# Patient Record
Sex: Male | Born: 1980 | Smoking: Never smoker
Health system: Southern US, Community
[De-identification: ages and names within clinical notes are randomized; demographics above are authoritative.]

## PROBLEM LIST (undated history)

## (undated) DIAGNOSIS — F419 Anxiety disorder, unspecified: Secondary | ICD-10-CM

## (undated) DIAGNOSIS — D352 Benign neoplasm of pituitary gland: Secondary | ICD-10-CM

## (undated) DIAGNOSIS — M542 Cervicalgia: Secondary | ICD-10-CM

## (undated) DIAGNOSIS — F32A Depression, unspecified: Secondary | ICD-10-CM

## (undated) DIAGNOSIS — K219 Gastro-esophageal reflux disease without esophagitis: Secondary | ICD-10-CM

## (undated) DIAGNOSIS — E039 Hypothyroidism, unspecified: Secondary | ICD-10-CM

## (undated) DIAGNOSIS — F909 Attention-deficit hyperactivity disorder, unspecified type: Secondary | ICD-10-CM

## (undated) DIAGNOSIS — J189 Pneumonia, unspecified organism: Secondary | ICD-10-CM

## (undated) HISTORY — DX: Anxiety disorder, unspecified: F41.9

## (undated) HISTORY — DX: Attention-deficit hyperactivity disorder, unspecified type: F90.9

## (undated) HISTORY — DX: Hypothyroidism, unspecified: E03.9

## (undated) HISTORY — DX: Depression, unspecified: F32.A

## (undated) HISTORY — PX: OTHER SURGICAL HISTORY: SHX169

## (undated) HISTORY — DX: Cervicalgia: M54.2

## (undated) HISTORY — DX: Benign neoplasm of pituitary gland: D35.2

---

## 2008-12-18 DIAGNOSIS — D352 Benign neoplasm of pituitary gland: Secondary | ICD-10-CM | POA: Diagnosis present

## 2016-01-23 ENCOUNTER — Other Ambulatory Visit: Payer: Self-pay

## 2016-03-24 ENCOUNTER — Encounter (INDEPENDENT_AMBULATORY_CARE_PROVIDER_SITE_OTHER): Payer: Self-pay | Admitting: Vascular Neurology

## 2016-03-24 ENCOUNTER — Ambulatory Visit (INDEPENDENT_AMBULATORY_CARE_PROVIDER_SITE_OTHER): Payer: No Typology Code available for payment source | Admitting: Vascular Neurology

## 2016-03-24 VITALS — BP 110/78 | HR 83 | Ht 74.0 in | Wt 205.0 lb

## 2016-03-24 DIAGNOSIS — F909 Attention-deficit hyperactivity disorder, unspecified type: Secondary | ICD-10-CM | POA: Diagnosis present

## 2016-03-24 DIAGNOSIS — R480 Dyslexia and alexia: Secondary | ICD-10-CM

## 2016-03-24 DIAGNOSIS — M542 Cervicalgia: Secondary | ICD-10-CM

## 2016-03-24 DIAGNOSIS — F819 Developmental disorder of scholastic skills, unspecified: Secondary | ICD-10-CM

## 2016-03-24 NOTE — Progress Notes (Signed)
Subjective:      Patient ID: Reginald Compton is a 35 y.o. male.    HPI   This is a 35 year old man who presents today for neurologic evaluation.  He was diagnosed with dyslexia as a young child and says that in grade school.  He had an IEP, as well as accommodations for tests, which she found to be very helpful.  His wife also reported that his mother apparently said he was noted to have attention deficit/hyperactivity disorder as a child.  He fidgeted as a kid, but does not fidget much now.  Currently he denies any significant concentration or focusing difficulties, perhaps he has some mild symptoms.  He has primarily difficulty with reading and spelling.  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  He has some exams coming up, and feels like he would benefit from additional time.    He denies any significant headaches, dizziness, weakness, numbness or tingling.  He does have a history of neck pain and other neck issues for which she saw a chiropractor and started exercises which have helped quite a bit.    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    He has a history of mild anxiety and depression, which is controlled currently.    The following portions of the patient's history were reviewed and updated as appropriate: allergies, current medications, past family history, past medical history, past social history, past surgical history and problem list.      Review of Systems    Constitutional: Negative for fever.   Respiratory: Negative for shortness of breath.    Cardiovascular: Negative for chest pain.   Gastrointestinal: Negative for abdominal pain.   Neurological: Negative for dizziness, weakness, numbness and headaches.   Psychiatric/Behavioral: Negative for sleep disturbance.   All other systems reviewed and are negative.    Objective:    Neurologic Exam  General: WDWN, NAD. Vital signs reviewed.  Heart: RRR  Neck: Supple, w/o bruits noted. Full ROM.  Extremities: No edema noted in the hands or feet.   Chest:  CTA  Abdomen: Non-tender/non-distended    NEUROLOGICAL EXAM    Mental Status: Awake, alert, oriented. Speech fluent. Follows commands well. Attention, memory, and concentration intact. Fund of knowledge appropriate for education.     Cranial Nerves: Pupils equally round and reactive to light bilaterally. Fundoscopic exam shows sharp discs without pallor bilaterally. Extraocular movements are full. No nystagmus seen. Facial sensation to light touch is intact. Face is symmetric. Hearing is intact to conversational speech. Tongue and palate are midline. Cranial nerves II-XII otherwise intact.     Motor: Full strength throughout. Bulk normal. No tremor noted. Tone appears normal.    Sensation: Light touch intact.     Coordination: Finger to nose and heel to shin testing intact without dysmetria.     Gait: Normal and steady.      DTRs: 2+ throughout. Toes downgoing.        Assessment:     1. Dyslexia    2. Learning disability    3. Neck pain    4. Hx of Attention deficit hyperactivity disorder (ADHD), unspecified ADHD type          Plan:     1.  Referral to neuropsychology.  2.  Continue physical therapy exercises for neck  pain.  3.  Further recommendations pending results of above.    4.  Follow-up with me in 2 months after testing complete.    The patient will return for follow-up as outlined above. If there are any problems with medications (if prescribed) or questions about any available test results, or if there are any new symptoms or changes in current symptoms, the patient is instructed to call the office.     Reginald B. Stacy Gardner, MD  Board Certified, Neurology  Board Certified, Clinical Neurophysiology  Board Certified, Vascular Neurology  Board Certified, Sleep Medicine

## 2016-03-28 ENCOUNTER — Encounter (INDEPENDENT_AMBULATORY_CARE_PROVIDER_SITE_OTHER): Payer: Self-pay

## 2016-05-26 ENCOUNTER — Ambulatory Visit (INDEPENDENT_AMBULATORY_CARE_PROVIDER_SITE_OTHER): Payer: No Typology Code available for payment source | Admitting: Vascular Neurology

## 2018-07-20 ENCOUNTER — Other Ambulatory Visit: Payer: Self-pay | Admitting: Urology

## 2020-04-28 ENCOUNTER — Other Ambulatory Visit (HOSPITAL_COMMUNITY)
Admission: RE | Admit: 2020-04-28 | Discharge: 2020-04-28 | Disposition: A | Discharge status: Home / Self Care | Payer: BC Managed Care – PPO | Source: Ambulatory Visit | Attending: Orthopaedic Surgery | Admitting: Orthopaedic Surgery

## 2020-04-28 DIAGNOSIS — S82851A Displaced trimalleolar fracture of right lower leg, initial encounter for closed fracture: Secondary | ICD-10-CM | POA: Diagnosis present

## 2020-04-28 DIAGNOSIS — S93401A Sprain of unspecified ligament of right ankle, initial encounter: Secondary | ICD-10-CM | POA: Diagnosis not present

## 2020-04-28 DIAGNOSIS — Z01812 Encounter for preprocedural laboratory examination: Secondary | ICD-10-CM | POA: Insufficient documentation

## 2020-04-28 DIAGNOSIS — Z20822 Contact with and (suspected) exposure to covid-19: Secondary | ICD-10-CM | POA: Insufficient documentation

## 2020-04-28 DIAGNOSIS — X58XXXA Exposure to other specified factors, initial encounter: Secondary | ICD-10-CM | POA: Diagnosis not present

## 2020-04-28 LAB — SARS CORONAVIRUS 2 (TAT 6-24 HRS): SARS Coronavirus 2: NEGATIVE

## 2020-04-29 ENCOUNTER — Other Ambulatory Visit: Payer: Self-pay | Admitting: Orthopaedic Surgery

## 2020-04-29 ENCOUNTER — Encounter (HOSPITAL_BASED_OUTPATIENT_CLINIC_OR_DEPARTMENT_OTHER): Payer: Self-pay | Admitting: Orthopaedic Surgery

## 2020-04-29 ENCOUNTER — Other Ambulatory Visit: Payer: Self-pay

## 2020-04-29 NOTE — Progress Notes (Signed)

## 2020-04-30 ENCOUNTER — Ambulatory Visit (HOSPITAL_BASED_OUTPATIENT_CLINIC_OR_DEPARTMENT_OTHER): Payer: BC Managed Care – PPO | Admitting: Anesthesiology

## 2020-04-30 ENCOUNTER — Encounter (HOSPITAL_BASED_OUTPATIENT_CLINIC_OR_DEPARTMENT_OTHER): Payer: Self-pay | Admitting: Orthopaedic Surgery

## 2020-04-30 ENCOUNTER — Ambulatory Visit (HOSPITAL_BASED_OUTPATIENT_CLINIC_OR_DEPARTMENT_OTHER)
Admission: RE | Admit: 2020-04-30 | Discharge: 2020-04-30 | Disposition: A | Discharge status: Home / Self Care | Payer: BC Managed Care – PPO | Attending: Orthopaedic Surgery | Admitting: Orthopaedic Surgery

## 2020-04-30 ENCOUNTER — Encounter (HOSPITAL_BASED_OUTPATIENT_CLINIC_OR_DEPARTMENT_OTHER)
Admission: RE | Disposition: A | Discharge status: Home / Self Care | Payer: Self-pay | Source: Home / Self Care | Attending: Orthopaedic Surgery

## 2020-04-30 ENCOUNTER — Ambulatory Visit (HOSPITAL_COMMUNITY): Payer: BC Managed Care – PPO

## 2020-04-30 ENCOUNTER — Other Ambulatory Visit: Payer: Self-pay

## 2020-04-30 DIAGNOSIS — Z20822 Contact with and (suspected) exposure to covid-19: Secondary | ICD-10-CM | POA: Insufficient documentation

## 2020-04-30 DIAGNOSIS — S93401A Sprain of unspecified ligament of right ankle, initial encounter: Secondary | ICD-10-CM | POA: Insufficient documentation

## 2020-04-30 DIAGNOSIS — X58XXXA Exposure to other specified factors, initial encounter: Secondary | ICD-10-CM | POA: Insufficient documentation

## 2020-04-30 DIAGNOSIS — S82851A Displaced trimalleolar fracture of right lower leg, initial encounter for closed fracture: Secondary | ICD-10-CM | POA: Insufficient documentation

## 2020-04-30 DIAGNOSIS — Z419 Encounter for procedure for purposes other than remedying health state, unspecified: Secondary | ICD-10-CM

## 2020-04-30 HISTORY — DX: Gastro-esophageal reflux disease without esophagitis: K21.9

## 2020-04-30 HISTORY — PX: ORIF ANKLE FRACTURE: SHX5408

## 2020-04-30 SURGERY — OPEN REDUCTION INTERNAL FIXATION (ORIF) ANKLE FRACTURE
Anesthesia: Regional | Site: Ankle | Laterality: Right

## 2020-04-30 MED ORDER — CEFAZOLIN SODIUM-DEXTROSE 2-4 GM/100ML-% IV SOLN
INTRAVENOUS | Status: AC
  Filled 2020-04-30: qty 100

## 2020-04-30 MED ORDER — FENTANYL CITRATE (PF) 100 MCG/2ML IJ SOLN
INTRAMUSCULAR | Status: AC
  Filled 2020-04-30: qty 2

## 2020-04-30 MED ORDER — FENTANYL CITRATE (PF) 100 MCG/2ML IJ SOLN: 25.0000 ug | INTRAMUSCULAR | Status: DC | PRN

## 2020-04-30 MED ORDER — ASPIRIN 325 MG PO TABS: 325.0000 mg | ORAL_TABLET | Freq: Every day | ORAL | 11 refills | Status: AC

## 2020-04-30 MED ORDER — LIDOCAINE HCL (CARDIAC) PF 100 MG/5ML IV SOSY
PREFILLED_SYRINGE | INTRAVENOUS | Status: DC | PRN
Start: 2020-04-30 — End: 2020-04-30
  Administered 2020-04-30: 70 mg via INTRATRACHEAL

## 2020-04-30 MED ORDER — ONDANSETRON HCL 4 MG/2ML IJ SOLN
INTRAMUSCULAR | Status: DC | PRN
  Administered 2020-04-30: 4 mg via INTRAVENOUS

## 2020-04-30 MED ORDER — MIDAZOLAM HCL 2 MG/2ML IJ SOLN
2.0000 mg | Freq: Once | INTRAMUSCULAR | Status: AC
  Administered 2020-04-30: 15:00:00 2 mg via INTRAVENOUS

## 2020-04-30 MED ORDER — OXYCODONE HCL 5 MG PO TABS
ORAL_TABLET | ORAL | Status: AC
  Filled 2020-04-30: qty 1

## 2020-04-30 MED ORDER — PHENYLEPHRINE 40 MCG/ML (10ML) SYRINGE FOR IV PUSH (FOR BLOOD PRESSURE SUPPORT)
PREFILLED_SYRINGE | INTRAVENOUS | Status: AC
  Filled 2020-04-30: qty 10

## 2020-04-30 MED ORDER — ACETAMINOPHEN 500 MG PO TABS
1000.0000 mg | ORAL_TABLET | Freq: Once | ORAL | Status: AC
  Administered 2020-04-30: 14:00:00 1000 mg via ORAL

## 2020-04-30 MED ORDER — DEXAMETHASONE SODIUM PHOSPHATE 10 MG/ML IJ SOLN
INTRAMUSCULAR | Status: DC | PRN
  Administered 2020-04-30 (×2): 5 mg

## 2020-04-30 MED ORDER — FENTANYL CITRATE (PF) 100 MCG/2ML IJ SOLN
100.0000 ug | Freq: Once | INTRAMUSCULAR | Status: AC
  Administered 2020-04-30: 15:00:00 100 ug via INTRAVENOUS

## 2020-04-30 MED ORDER — OXYCODONE HCL 5 MG PO TABS
5.0000 mg | ORAL_TABLET | Freq: Once | ORAL | Status: AC
  Administered 2020-04-30: 19:00:00 5 mg via ORAL

## 2020-04-30 MED ORDER — FENTANYL CITRATE (PF) 100 MCG/2ML IJ SOLN
INTRAMUSCULAR | Status: DC | PRN
  Administered 2020-04-30 (×2): 50 ug via INTRAVENOUS

## 2020-04-30 MED ORDER — LIDOCAINE 2% (20 MG/ML) 5 ML SYRINGE
INTRAMUSCULAR | Status: AC
  Filled 2020-04-30: qty 5

## 2020-04-30 MED ORDER — MIDAZOLAM HCL 2 MG/2ML IJ SOLN
INTRAMUSCULAR | Status: AC
  Filled 2020-04-30: qty 2

## 2020-04-30 MED ORDER — ACETAMINOPHEN 500 MG PO TABS
ORAL_TABLET | ORAL | Status: AC
  Filled 2020-04-30: qty 2

## 2020-04-30 MED ORDER — ROPIVACAINE HCL 5 MG/ML IJ SOLN
INTRAMUSCULAR | Status: DC | PRN
  Administered 2020-04-30: 20 mL via PERINEURAL
  Administered 2020-04-30: 30 mL via PERINEURAL

## 2020-04-30 MED ORDER — PROPOFOL 10 MG/ML IV BOLUS
INTRAVENOUS | Status: DC | PRN
  Administered 2020-04-30: 200 mg via INTRAVENOUS

## 2020-04-30 MED ORDER — CEFAZOLIN SODIUM-DEXTROSE 2-4 GM/100ML-% IV SOLN
2.0000 g | INTRAVENOUS | Status: AC
  Administered 2020-04-30: 17:00:00 2 g via INTRAVENOUS

## 2020-04-30 MED ORDER — OXYCODONE HCL 5 MG PO TABS: 5.0000 mg | ORAL_TABLET | ORAL | 0 refills | Status: AC | PRN

## 2020-04-30 MED ORDER — PROPOFOL 10 MG/ML IV BOLUS
INTRAVENOUS | Status: AC
  Filled 2020-04-30: qty 20

## 2020-04-30 MED ORDER — ONDANSETRON HCL 4 MG/2ML IJ SOLN
INTRAMUSCULAR | Status: AC
  Filled 2020-04-30: qty 2

## 2020-04-30 MED ORDER — LACTATED RINGERS IV SOLN: INTRAVENOUS | Status: DC

## 2020-04-30 MED ORDER — PHENYLEPHRINE HCL (PRESSORS) 10 MG/ML IV SOLN
INTRAVENOUS | Status: DC | PRN
  Administered 2020-04-30 (×2): 80 ug via INTRAVENOUS

## 2020-04-30 SURGICAL SUPPLY — 83 items
APL PRP STRL LF DISP 70% ISPRP (MISCELLANEOUS) ×1
APL SKNCLS STERI-STRIP NONHPOA (GAUZE/BANDAGES/DRESSINGS)
BANDAGE ESMARK 6X9 LF (GAUZE/BANDAGES/DRESSINGS) ×1 IMPLANT
BENZOIN TINCTURE PRP APPL 2/3 (GAUZE/BANDAGES/DRESSINGS) IMPLANT
BIT DRILL 2 CANN GRADUATED (BIT) ×3 IMPLANT
BIT DRILL 2.5 CANN LNG (BIT) ×3 IMPLANT
BIT DRILL 2.6 CANN (BIT) ×3 IMPLANT
BIT DRILL 3 CANN ENDOSCOPIC (BIT) ×3 IMPLANT
BLADE SURG 15 STRL LF DISP TIS (BLADE) ×4 IMPLANT
BLADE SURG 15 STRL SS (BLADE) ×12
BNDG CMPR 9X6 STRL LF SNTH (GAUZE/BANDAGES/DRESSINGS) ×1
BNDG COHESIVE 4X5 TAN STRL (GAUZE/BANDAGES/DRESSINGS) IMPLANT
BNDG ELASTIC 6X5.8 VLCR STR LF (GAUZE/BANDAGES/DRESSINGS) ×6 IMPLANT
BNDG ESMARK 6X9 LF (GAUZE/BANDAGES/DRESSINGS) ×3
CHLORAPREP W/TINT 26 (MISCELLANEOUS) ×3 IMPLANT
CLOSURE WOUND 1/2 X4 (GAUZE/BANDAGES/DRESSINGS)
COVER BACK TABLE 60X90IN (DRAPES) ×3 IMPLANT
COVER MAYO STAND REUSABLE (DRAPES) ×3 IMPLANT
COVER MAYO STAND STRL (DRAPES) ×3 IMPLANT
COVER SURGICAL LIGHT HANDLE (MISCELLANEOUS) ×6 IMPLANT
COVER WAND RF STERILE (DRAPES) IMPLANT
CUFF TOURN SGL QUICK 34 (TOURNIQUET CUFF) ×3
CUFF TRNQT CYL 34X4.125X (TOURNIQUET CUFF) ×1 IMPLANT
DECANTER SPIKE VIAL GLASS SM (MISCELLANEOUS) IMPLANT
DRAPE C-ARM 42X72 X-RAY (DRAPES) IMPLANT
DRAPE C-ARMOR (DRAPES) ×3 IMPLANT
DRAPE EXTREMITY T 121X128X90 (DISPOSABLE) ×3 IMPLANT
DRAPE IMP U-DRAPE 54X76 (DRAPES) ×3 IMPLANT
DRAPE U-SHAPE 47X51 STRL (DRAPES) ×3 IMPLANT
ELECT REM PT RETURN 9FT ADLT (ELECTROSURGICAL) ×3
ELECTRODE REM PT RTRN 9FT ADLT (ELECTROSURGICAL) ×1 IMPLANT
GAUZE SPONGE 4X4 12PLY STRL (GAUZE/BANDAGES/DRESSINGS) ×3 IMPLANT
GAUZE XEROFORM 1X8 LF (GAUZE/BANDAGES/DRESSINGS) ×3 IMPLANT
GLOVE BIOGEL PI IND STRL 7.0 (GLOVE) ×1 IMPLANT
GLOVE BIOGEL PI INDICATOR 7.0 (GLOVE) ×2
GLOVE ORTHO TXT STRL SZ7.5 (GLOVE) ×3 IMPLANT
GLOVE SRG 8 PF TXTR STRL LF DI (GLOVE) ×1 IMPLANT
GLOVE SURG ENC TEXT LTX SZ7.5 (GLOVE) ×3 IMPLANT
GLOVE SURG UNDER POLY LF SZ8 (GLOVE) ×3
GOWN STRL REUS W/ TWL LRG LVL3 (GOWN DISPOSABLE) ×1 IMPLANT
GOWN STRL REUS W/ TWL XL LVL3 (GOWN DISPOSABLE) ×1 IMPLANT
GOWN STRL REUS W/TWL LRG LVL3 (GOWN DISPOSABLE) ×3
GOWN STRL REUS W/TWL XL LVL3 (GOWN DISPOSABLE) ×3
GUIDEWIRE 1.35MM (WIRE) ×6 IMPLANT
NS IRRIG 1000ML POUR BTL (IV SOLUTION) ×3 IMPLANT
PACK BASIN DAY SURGERY FS (CUSTOM PROCEDURE TRAY) ×3 IMPLANT
PAD CAST 4YDX4 CTTN HI CHSV (CAST SUPPLIES) ×1 IMPLANT
PADDING CAST COTTON 4X4 STRL (CAST SUPPLIES) ×3
PADDING CAST SYNTHETIC 4 (CAST SUPPLIES) ×4
PADDING CAST SYNTHETIC 4X4 STR (CAST SUPPLIES) ×2 IMPLANT
PENCIL SMOKE EVACUATOR (MISCELLANEOUS) ×3 IMPLANT
PLATE TITANIUM 6H RT FIB ANKLE (Plate) ×3 IMPLANT
SCREW CORTICAL 3MMX16MM (Screw) ×3 IMPLANT
SCREW CORTICAL 3MMX18MM (Screw) ×6 IMPLANT
SCREW CORTICAL LP 3.0X12MM (Screw) ×3 IMPLANT
SCREW LO-PRO TI 3.5X16MM (Screw) ×6 IMPLANT
SCREW LOCK 18X3XVALOPRFL (Screw) ×1 IMPLANT
SCREW LOCK TI QF 3X14 (Screw) ×6 IMPLANT
SCREW LOCKING 3.0X18 (Screw) ×3 IMPLANT
SCREW LP TI 3.5X14MM (Screw) ×6 IMPLANT
SCREW QCKFIX CANN 4.0X40MM (Screw) ×6 IMPLANT
SHEET MEDIUM DRAPE 40X70 STRL (DRAPES) ×3 IMPLANT
SLEEVE SCD COMPRESS KNEE MED (MISCELLANEOUS) ×3 IMPLANT
SPLINT FAST PLASTER 5X30 (CAST SUPPLIES) ×40
SPLINT PLASTER CAST FAST 5X30 (CAST SUPPLIES) ×20 IMPLANT
SPONGE LAP 18X18 RF (DISPOSABLE) ×6 IMPLANT
STAPLER VISISTAT 35W (STAPLE) ×3 IMPLANT
STOCKINETTE 6  STRL (DRAPES) ×2
STOCKINETTE 6 STRL (DRAPES) ×1 IMPLANT
STRIP CLOSURE SKIN 1/2X4 (GAUZE/BANDAGES/DRESSINGS) IMPLANT
SUCTION FRAZIER HANDLE 10FR (MISCELLANEOUS) ×2
SUCTION TUBE FRAZIER 10FR DISP (MISCELLANEOUS) ×1 IMPLANT
SUT ETHILON 3 0 PS 1 (SUTURE) ×3 IMPLANT
SUT MNCRL AB 3-0 PS2 18 (SUTURE) ×6 IMPLANT
SUT PDS AB 2-0 CT2 27 (SUTURE) ×3 IMPLANT
SUT VIC AB 2-0 SH 27 (SUTURE)
SUT VIC AB 2-0 SH 27XBRD (SUTURE) IMPLANT
SUT VIC AB 3-0 FS2 27 (SUTURE) IMPLANT
SYR BULB EAR ULCER 3OZ GRN STR (SYRINGE) ×3 IMPLANT
TOWEL GREEN STERILE FF (TOWEL DISPOSABLE) ×6 IMPLANT
TUBE CONNECTING 20'X1/4 (TUBING) ×1
TUBE CONNECTING 20X1/4 (TUBING) ×2 IMPLANT
UNDERPAD 30X36 HEAVY ABSORB (UNDERPADS AND DIAPERS) ×3 IMPLANT

## 2020-04-30 NOTE — Anesthesia Preprocedure Evaluation (Addendum)
Anesthesia Evaluation  Patient identified by MRN, date of birth, ID band Patient awake    Reviewed: Allergy & Precautions, NPO status , Patient's Chart, lab work & pertinent test results  Airway Mallampati: I  TM Distance: >3 FB Neck ROM: Full    Dental no notable dental hx. (+) Teeth Intact, Dental Advisory Given   Pulmonary neg pulmonary ROS,    Pulmonary exam normal breath sounds clear to auscultation       Cardiovascular negative cardio ROS Normal cardiovascular exam Rhythm:Regular Rate:Normal     Neuro/Psych negative neurological ROS  negative psych ROS   GI/Hepatic Neg liver ROS, GERD  Medicated and Controlled,  Endo/Other  negative endocrine ROS  Renal/GU negative Renal ROS  negative genitourinary   Musculoskeletal negative musculoskeletal ROS (+)   Abdominal   Peds  Hematology negative hematology ROS (+)   Anesthesia Other Findings   Reproductive/Obstetrics                            Anesthesia Physical Anesthesia Plan  ASA: II  Anesthesia Plan: General and Regional   Post-op Pain Management:  Regional for Post-op pain   Induction: Intravenous  PONV Risk Score and Plan: 2 and Ondansetron, Dexamethasone and Midazolam  Airway Management Planned: LMA  Additional Equipment:   Intra-op Plan:   Post-operative Plan: Extubation in OR  Informed Consent: I have reviewed the patients History and Physical, chart, labs and discussed the procedure including the risks, benefits and alternatives for the proposed anesthesia with the patient or authorized representative who has indicated his/her understanding and acceptance.     Dental advisory given  Plan Discussed with: CRNA  Anesthesia Plan Comments:         Anesthesia Quick Evaluation

## 2020-04-30 NOTE — Anesthesia Procedure Notes (Signed)
Anesthesia Regional Block: Popliteal block   Pre-Anesthetic Checklist: ,, timeout performed, Correct Patient, Correct Site, Correct Laterality, Correct Procedure, Correct Position, site marked, Risks and benefits discussed,  Surgical consent,  Pre-op evaluation,  At surgeon's request and post-op pain management  Laterality: Right  Prep: Maximum Sterile Barrier Precautions used, chloraprep       Needles:  Injection technique: Single-shot  Needle Type: Echogenic Stimulator Needle     Needle Length: 9cm  Needle Gauge: 22     Additional Needles:   Procedures:,,,, ultrasound used (permanent image in chart),,,,  Narrative:  Start time: 04/30/2020 2:53 PM End time: 04/30/2020 3:03 PM Injection made incrementally with aspirations every 5 mL.  Performed by: Personally  Anesthesiologist: Freddrick March, MD  Additional Notes: Monitors applied. No increased pain on injection. No increased resistance to injection. Injection made in 5cc increments. Good needle visualization. Patient tolerated procedure well.

## 2020-04-30 NOTE — Op Note (Addendum)
George Cooley male 39 y.o. 04/30/2020  PreOperative Diagnosis: Right trimalleolar ankle fracture dislocation   PostOperative Diagnosis: Right trimalleolar ankle fracture dislocation Syndesmotic disruption  PROCEDURE: Open treatment of right trimalleolar ankle fracture without posterior fixation Open treatment of syndesmosis Ankle stress view fluoroscopy  SURGEON: Melony Overly, MD  ASSISTANT: None  ANESTHESIA: General LMA with peripheral nerve blockade  FINDINGS: Long oblique fracture of the lateral malleolus with associated medial malleolar and posterior malleolar fragmentation. Avulsion of the syndesmotic ligaments  IMPLANTS: Arthrex distal fibular locking plate with 4-0 cannulated screws Fully threaded screws, 3.0 mm  INDICATIONS:39 y.o. male sustained a trimalleolar ankle fracture dislocation while getting out of a car.  It was a low energy but had significant displacement.  He was seen in the emergency department where closed reduction was performed.  Splint was placed.  He had gross instability.  He was seen in my office with continued displacement and meeting indications for open treatment.  There was concern for syndesmotic disruption as well.  Patient was indicated for surgery.   Patient understood the risks, benefits and alternatives to surgery which include but are not limited to wound healing complications, infection, nonunion, malunion, need for further surgery as well as damage to surrounding structures. They also understood the potential for continued pain in that there were no guarantees of acceptable outcome After weighing these risks the patient opted to proceed with surgery.  PROCEDURE: Patient was identified in the preoperative holding area.  The right leg was marked by myself.  Consent was signed by myself and the patient.  Block was performed by anesthesia in the preoperative holding area.  Patient was taken to the operative suite and placed supine on the  operative table.  General LMA anesthesia was induced without difficulty. Bump was placed under the operative hip and bone foam was used.  All bony prominences were well padded.  Tourniquet was placed on the operative thigh.  Preoperative antibiotics were given. The extremity was prepped and draped in the usual sterile fashion and surgical timeout was performed.  The limb was elevated and the tourniquet was inflated to 250 mmHg.  We began by making a longitudinal incision overlying the distal fibula.  This was taken sharply down through skin and subcutaneous tissue.  Blunt dissection was used to identify any branch of the superficial peroneal nerve was not visualized in the surgical field.  The incision was then taken sharply down to bone and the fracture site was identified.  The fracture site was mobilized.   The fracture site were cleaned with a rondure and curette of any fracture hematoma and callus formation.  Then the fracture of the fibula was reduced under direct visualization and held provisionally with a lobster claw.  Then fluoroscopy confirmed adequate reduction of the ankle mortise at that time.  Then a combination of locking and nonlocking screws were used after placement of a lag screw across the fracture by technique.  This provided good stability of the distal fibula fracture.  We then turned our attention to the medial malleolus.  There was continued displacement of the medial malleolus and therefore an incision was made overlying this.  This was taken sharply down through skin and subcutaneous tissue.  Bovie cautery was used for skin bleeders.  Then sharp dissection down to the fracture and the fracture site was identified.  The soft tissue flap was carried anteriorly to identify the medial gutter of the ankle joint.  Then the fracture site was mobilized and using a  curette and rondure the fracture was cleared out of hematoma and fracture callus.  Then the fracture was reduced and held  provisionally with a pointed reduction forcep.  This was done under direct visualization.  Then fluoroscopy confirmed adequate reduction.  2 4.0 mm partially-threaded cannulated screws were placed across the fracture site with good fixation.  The ankle was stressed and was found to be unstable with regard to the syndesmosis and there was concern for avulsion of the syndesmotic ligaments.    Further inspection after a separate deep incision was created anterior to the fibula the avulsed fragments of the syndesmotic ligaments about the AITFL were identified and reduced.  They were held provisionally with pointed reduction forcep.  In the syndesmosis was held reduced this way.  Then small 3.0 millimeter screws were used to fixate the bony fragmentations to reestablish the syndesmosis stability.  The wounds were irrigated and the deep tissue was closed with a 3-0 Monocryl.  The subcuticular tissue was closed with 3-0 Monocryl and the skin with staples.  Xeroform placed on the wounds as well as 4 x 4's and sterile she cotton.  He was placed in a nonweightbearing short leg splint.  He tolerated this well.  There were no complications.  He was awakened from anesthesia and taken recovery in stable condition.  POST OPERATIVE INSTRUCTIONS: Nonweightbearing on operative extremity Keep splint dry and limb elevated Continue 325 mg aspirin for DVT prophylaxis Call the office with concerns Follow-up in 2 weeks for splint removal, x-rays of the operative ankle, nonweightbearing and suture removal if appropriate.   TOURNIQUET TIME: Less than 1.5 hours  BLOOD LOSS:  Minimal         DRAINS: none         SPECIMEN: none       COMPLICATIONS:  * No complications entered in OR log *         Disposition: PACU - hemodynamically stable.         Condition: stable

## 2020-04-30 NOTE — Anesthesia Procedure Notes (Signed)
Anesthesia Regional Block: Adductor canal block   Pre-Anesthetic Checklist: ,, timeout performed, Correct Patient, Correct Site, Correct Laterality, Correct Procedure, Correct Position, site marked, Risks and benefits discussed,  Surgical consent,  Pre-op evaluation,  At surgeon's request and post-op pain management  Laterality: Right  Prep: Maximum Sterile Barrier Precautions used, chloraprep       Needles:  Injection technique: Single-shot  Needle Type: Echogenic Stimulator Needle     Needle Length: 9cm  Needle Gauge: 22     Additional Needles:   Procedures:,,,, ultrasound used (permanent image in chart),,,,  Narrative:  Start time: 04/30/2020 3:03 PM End time: 04/30/2020 3:07 PM Injection made incrementally with aspirations every 5 mL.  Performed by: Personally  Anesthesiologist: Freddrick March, MD  Additional Notes: Monitors applied. No increased pain on injection. No increased resistance to injection. Injection made in 5cc increments. Good needle visualization. Patient tolerated procedure well.

## 2020-04-30 NOTE — Anesthesia Procedure Notes (Signed)
Procedure Name: LMA Insertion Date/Time: 04/30/2020 4:33 PM Performed by: Glory Buff, CRNA Pre-anesthesia Checklist: Patient identified, Emergency Drugs available, Suction available and Patient being monitored Patient Re-evaluated:Patient Re-evaluated prior to induction Oxygen Delivery Method: Circle system utilized Preoxygenation: Pre-oxygenation with 100% oxygen Induction Type: IV induction LMA: LMA inserted LMA Size: 5.0 Number of attempts: 1 Placement Confirmation: positive ETCO2 Tube secured with: Tape Dental Injury: Teeth and Oropharynx as per pre-operative assessment

## 2020-04-30 NOTE — Transfer of Care (Signed)
Immediate Anesthesia Transfer of Care Note  Patient: George Cooley  Procedure(s) Performed: OPEN TREATMENT OF RIGHT TRIMALLEOLAR FRACTURE POSSIBLE SYNDESMOSIS (Right Ankle)  Patient Location: PACU  Anesthesia Type:General  Level of Consciousness: drowsy, patient cooperative and responds to stimulation  Airway & Oxygen Therapy: Patient Spontanous Breathing and Patient connected to face mask oxygen  Post-op Assessment: Report given to RN and Post -op Vital signs reviewed and stable  Post vital signs: Reviewed and stable  Last Vitals:  Vitals Value Taken Time  BP    Temp    Pulse 83 04/30/20 1813  Resp 14 04/30/20 1813  SpO2 98 % 04/30/20 1813  Vitals shown include unvalidated device data.  Last Pain:  Vitals:   04/30/20 1411  TempSrc: Oral  PainSc: 5          Complications: No complications documented.

## 2020-04-30 NOTE — Discharge Instructions (Signed)
[facility] POST-OP INSTRUCTIONS   Pain Management 1. The numbing medicine and your leg will last around 18 hours, take a dose of your pain medicine as soon as you feel it wearing off to avoid rebound pain. 2. Keep your foot elevated above heart level.  Make sure that your heel hangs free ('floats'). 3. Take all prescribed medication as directed. 4. If taking narcotic pain medication you may want to use an over-the-counter stool softener to avoid constipation. 5. You may take over-the-counter NSAIDs (ibuprofen, naproxen, etc.) as well as over-the-counter acetaminophen as directed on the packaging as a supplement for your pain and may also use it to wean away from the prescription medication.  Activity ? Non-weightbearing ? Keep splint intact  First Postoperative Visit 1. Your first postop visit will be at least 2 weeks after surgery.  This should be scheduled when you schedule surgery. 2. If you do not have a postoperative visit scheduled please call [phone] to schedule an appointment. 3. At the appointment your incision will be evaluated for suture removal, x-rays will be obtained if necessary.  General Instructions 1. Swelling is very common after foot and ankle surgery.  It often takes 3 months for the foot and ankle to begin to feel comfortable.  Some amount of swelling will persist for 6-12 months. 2. DO NOT change the dressing.  If there is a problem with the dressing (too tight, loose, gets wet, etc.) please contact Dr. Adair's office. 3. DO NOT get the dressing wet.  For showers you can use an over-the-counter cast cover or wrap a washcloth around the top of your dressing and then cover it with a plastic bag and tape it to your leg. 4. DO NOT soak the incision (no tubs, pools, bath, etc.) until you have approval from Dr. Adair.  Contact Dr. Adairs office or go to Emergency Room if: 1. Temperature above 101 F. 2. Increasing pain that is unresponsive to pain  medication or elevation 3. Excessive redness or swelling in your foot 4. Dressing problems - excessive bloody drainage, looseness or tightness, or if dressing gets wet 5. Develop pain, swelling, warmth, or discoloration of your calf  Post Anesthesia Home Care Instructions  Activity: Get plenty of rest for the remainder of the day. A responsible individual must stay with you for 24 hours following the procedure.  For the next 24 hours, DO NOT: -Drive a car -Operate machinery -Drink alcoholic beverages -Take any medication unless instructed by your physician -Make any legal decisions or sign important papers.  Meals: Start with liquid foods such as gelatin or soup. Progress to regular foods as tolerated. Avoid greasy, spicy, heavy foods. If nausea and/or vomiting occur, drink only clear liquids until the nausea and/or vomiting subsides. Call your physician if vomiting continues.  Special Instructions/Symptoms: Your throat may feel dry or sore from the anesthesia or the breathing tube placed in your throat during surgery. If this causes discomfort, gargle with warm salt water. The discomfort should disappear within 24 hours.  If you had a scopolamine patch placed behind your ear for the management of post- operative nausea and/or vomiting:  1. The medication in the patch is effective for 72 hours, after which it should be removed.  Wrap patch in a tissue and discard in the trash. Wash hands thoroughly with soap and water. 2. You may remove the patch earlier than 72 hours if you experience unpleasant side effects which may include dry mouth, dizziness or   or visual disturbances. 3. Avoid touching the patch. Wash your hands with soap and water after contact with the patch.     Regional Anesthesia Blocks  1. Numbness or the inability to move the "blocked" extremity may last from 3-48 hours after placement. The length of time depends on the medication injected and your individual response to  the medication. If the numbness is not going away after 48 hours, call your surgeon.  2. The extremity that is blocked will need to be protected until the numbness is gone and the  Strength has returned. Because you cannot feel it, you will need to take extra care to avoid injury. Because it may be weak, you may have difficulty moving it or using it. You may not know what position it is in without looking at it while the block is in effect.  3. For blocks in the legs and feet, returning to weight bearing and walking needs to be done carefully. You will need to wait until the numbness is entirely gone and the strength has returned. You should be able to move your leg and foot normally before you try and bear weight or walk. You will need someone to be with you when you first try to ensure you do not fall and possibly risk injury.  4. Bruising and tenderness at the needle site are common side effects and will resolve in a few days.  5. Persistent numbness or new problems with movement should be communicated to the surgeon or the [facility] ([phone])/ [facility] ([phone]).  

## 2020-04-30 NOTE — H&P (Signed)
PREOPERATIVE H&P  Chief Complaint: Right trimalleolar ankle fracture dislocation  HPI: George Cooley is a 39 y.o. male who presents for preoperative history and physical with a diagnosis of right trimalleolar ankle fracture dislocation.  Patient sustained this fracture less than 1 week ago.  He was seen in the emergency department where closed reduction was performed.  He had gross instability and was splinted.  He was seen in my office.  Given amount of displacement he was indicated for surgery.  Today he is here for surgical intervention in the form of open treatment of his ankle fracture with possible syndesmotic fixation. Symptoms are rated as moderate to severe, and have been worsening.  This is significantly impairing activities of daily living.  He has elected for surgical management.   Past Medical History:  Diagnosis Date  . GERD (gastroesophageal reflux disease)    Past Surgical History:  Procedure Laterality Date  . tendon on toe repair     Social History   Socioeconomic History  . Marital status: Married    Spouse name: Not on file  . Number of children: Not on file  . Years of education: Not on file  . Highest education level: Not on file  Occupational History  . Not on file  Tobacco Use  . Smoking status: Never Smoker  . Smokeless tobacco: Never Used  Vaping Use  . Vaping Use: Never used  Substance and Sexual Activity  . Alcohol use: Yes    Comment: 1/week  . Drug use: Never  . Sexual activity: Yes  Other Topics Concern  . Not on file  Social History Narrative  . Not on file   Social Determinants of Health   Financial Resource Strain: Not on file  Food Insecurity: Not on file  Transportation Needs: Not on file  Physical Activity: Not on file  Stress: Not on file  Social Connections: Not on file   History reviewed. No pertinent family history. No Known Allergies Prior to Admission medications   Medication Sig Start Date End Date Taking? Authorizing  Provider  esomeprazole (NEXIUM) 20 MG capsule Take 20 mg by mouth daily before breakfast.   Yes [provider]  fexofenadine (ALLEGRA) 180 MG tablet Take 180 mg by mouth daily with breakfast.   Yes [provider]  FIBER SELECT GUMMIES PO Take 2 tablets by mouth daily. 5g/per gummies   Yes [provider]  HYDROcodone-acetaminophen (NORCO/VICODIN) 5-325 MG tablet Take 1 tablet by mouth every 6 (six) hours as needed (pain.).   Yes [provider]  ibuprofen (ADVIL) 200 MG tablet Take 600 mg by mouth every 6 (six) hours as needed (for pain.).   Yes [provider]  Multiple Vitamin (MULTIVITAMIN WITH MINERALS) TABS tablet Take 1 tablet by mouth daily. One A Day Men's 50+   Yes [provider]  Omega-3 Fatty Acids (FISH OIL) 1200 MG CAPS Take 1,200 mg by mouth daily.   Yes [provider]  oxyCODONE-acetaminophen (PERCOCET/ROXICET) 5-325 MG tablet Take 1 tablet by mouth 3 (three) times daily as needed (for pain).   Yes [provider]  Probiotic Product (PROBIOTIC PO) Take 1 capsule by mouth daily.   Yes [provider]     Positive ROS: All other systems have been reviewed and were otherwise negative with the exception of those mentioned in the HPI and as above.  Physical Exam:  Vitals:   04/30/20 1510 04/30/20 1515  BP: 110/68 109/74  Pulse: 86 79  Resp: 20  20  Temp:    SpO2: 98% 99%   General: Alert, no acute distress Cardiovascular: No pedal edema Respiratory: No cyanosis, no use of accessory musculature GI: No organomegaly, abdomen is soft and non-tender Skin: No lesions in the area of chief complaint Neurologic: Sensation intact distally Psychiatric: Patient is competent for consent with normal mood and affect Lymphatic: No axillary or cervical lymphadenopathy  MUSCULOSKELETAL: Right leg demonstrates swelling and ecchymosis about the ankle and foot.  He has tenderness about the lateral medial  malleolus as well as the anterior ankle.  No skin tenting medially.  No tenderness about the dorsal midfoot.  Palpable dorsalis pedis pulse.  Sensation grossly intact distally.  Foot is warm and well-perfused.  Assessment: Right trimalleolar ankle fracture with possible syndesmotic disruption   Plan: Plan for open treatment of his right trimalleolar ankle fracture without posterior fixation.  We will then stress the syndesmosis and fix or stabilize as needed..  We discussed the risks, benefits and alternatives of surgery which include but are not limited to wound healing complications, infection, nonunion, malunion, need for further surgery, damage to surrounding structures and continued pain.  They understand there is no guarantees to an acceptable outcome.  After weighing these risks they opted to proceed with surgery.     Erle Crocker, MD    04/30/2020 4:04 PM

## 2020-04-30 NOTE — Progress Notes (Signed)
Assisted Dr. Lanetta Inch with right, ultrasound guided, popliteal, adductor canal block. Side rails up, monitors on throughout procedure. See vital signs in flow sheet. Tolerated Procedure well.

## 2020-05-01 NOTE — Anesthesia Postprocedure Evaluation (Signed)
Anesthesia Post Note  Patient: Control and instrumentation engineer  Procedure(s) Performed: OPEN TREATMENT OF RIGHT TRIMALLEOLAR FRACTURE POSSIBLE SYNDESMOSIS (Right Ankle)     Patient location during evaluation: PACU Anesthesia Type: Regional and General Level of consciousness: awake and alert Pain management: pain level controlled Vital Signs Assessment: post-procedure vital signs reviewed and stable Respiratory status: spontaneous breathing, nonlabored ventilation and respiratory function stable Cardiovascular status: blood pressure returned to baseline and stable Postop Assessment: no apparent nausea or vomiting Anesthetic complications: no   No complications documented.  Last Vitals:  Vitals:   04/30/20 1830 04/30/20 1851  BP: 109/78 117/84  Pulse: 81 80  Resp: 14 16  Temp:  36.5 C  SpO2: 95% 97%    Last Pain:  Vitals:   04/30/20 1846  TempSrc:   PainSc: 0-No pain                 Chakira Jachim,W. EDMOND

## 2020-05-04 ENCOUNTER — Encounter (HOSPITAL_BASED_OUTPATIENT_CLINIC_OR_DEPARTMENT_OTHER): Payer: Self-pay | Admitting: Orthopaedic Surgery

## 2021-01-22 ENCOUNTER — Other Ambulatory Visit: Payer: Self-pay | Admitting: Internal Medicine

## 2021-01-22 DIAGNOSIS — D352 Benign neoplasm of pituitary gland: Secondary | ICD-10-CM

## 2021-01-22 DIAGNOSIS — E221 Hyperprolactinemia: Secondary | ICD-10-CM

## 2021-02-02 ENCOUNTER — Other Ambulatory Visit: Payer: Self-pay | Admitting: Internal Medicine

## 2021-02-02 DIAGNOSIS — D352 Benign neoplasm of pituitary gland: Secondary | ICD-10-CM

## 2021-02-12 ENCOUNTER — Ambulatory Visit
Admission: RE | Admit: 2021-02-12 | Discharge: 2021-02-12 | Disposition: A | Discharge status: Home / Self Care | Payer: BC Managed Care – PPO | Source: Ambulatory Visit | Attending: Internal Medicine | Admitting: Internal Medicine

## 2021-02-12 DIAGNOSIS — E221 Hyperprolactinemia: Secondary | ICD-10-CM

## 2021-02-12 DIAGNOSIS — D352 Benign neoplasm of pituitary gland: Secondary | ICD-10-CM

## 2021-02-12 MED ORDER — GADOBENATE DIMEGLUMINE 529 MG/ML IV SOLN
10.0000 mL | Freq: Once | INTRAVENOUS | Status: AC | PRN
  Administered 2021-02-12: 10 mL via INTRAVENOUS

## 2021-04-27 IMAGING — RF DG C-ARM 1-60 MIN
1 series · 3 of 3 positions shown · non-contrast
Comparison: None.

CLINICAL DATA: Right ankle ORIF.

EXAM:
RIGHT ANKLE - 2 VIEW; DG C-ARM 1-60 MIN

[Series 1: run · 3 of 3 slices shown]
[im 1/3]
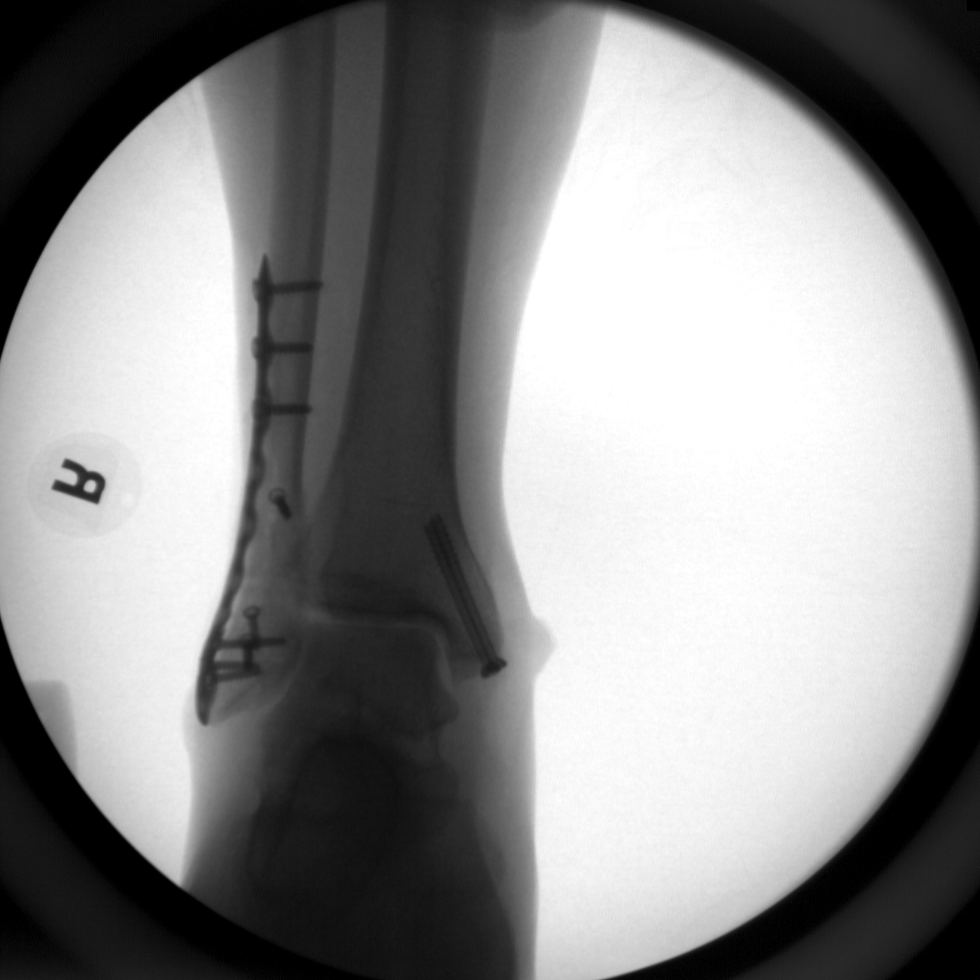
[im 2/3]
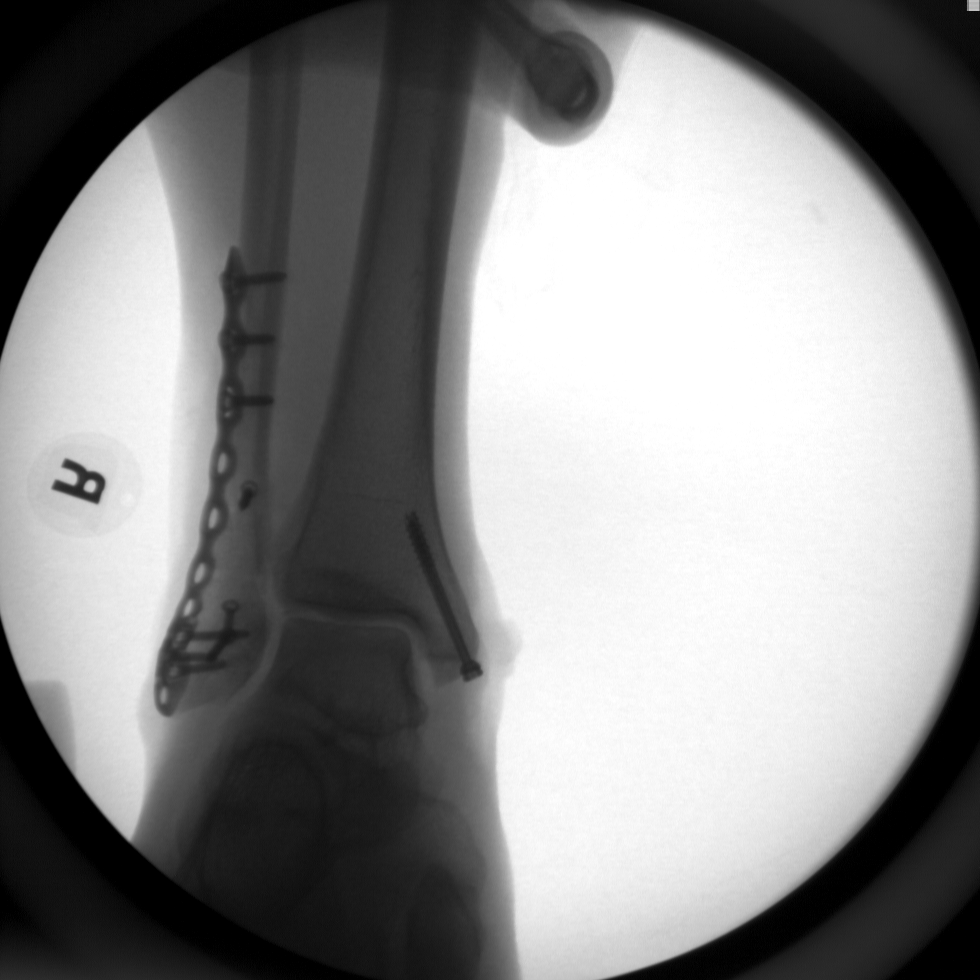
[im 3/3]
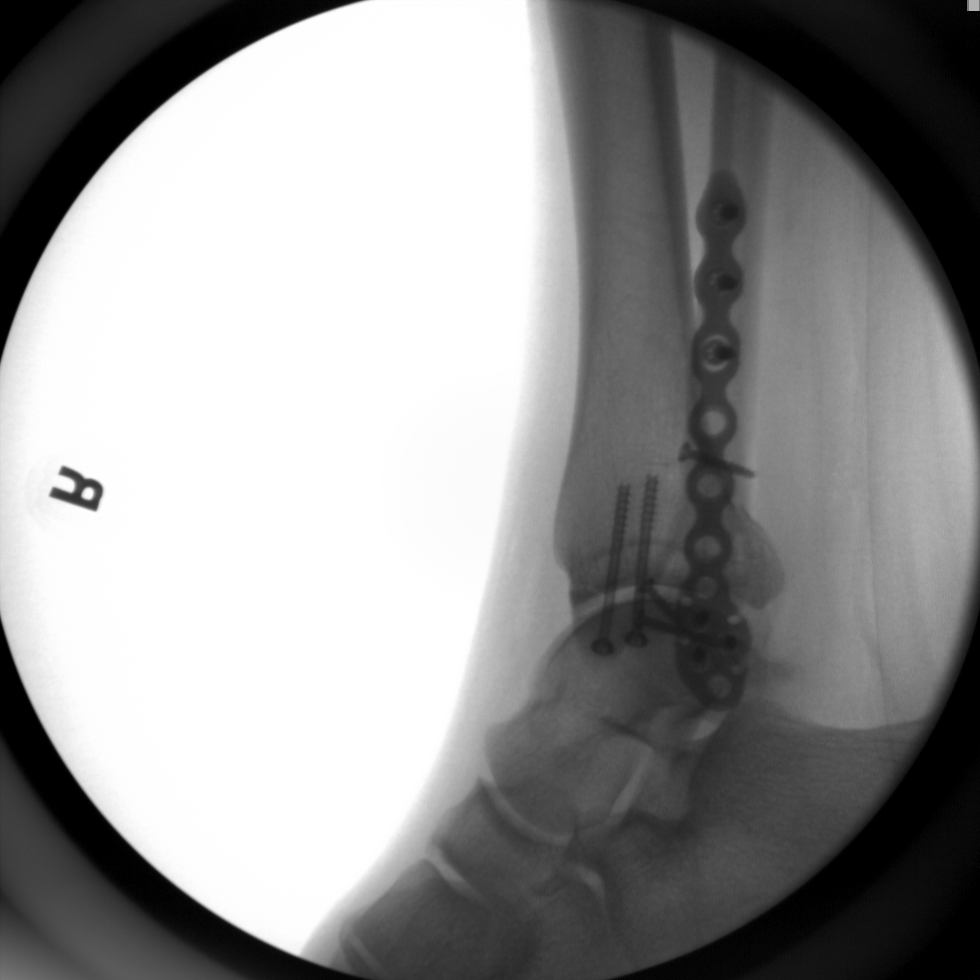

[3 of 3 positions shown; findings below may reference images not displayed]

FINDINGS: Three fluoroscopic spot views of the right ankle obtained in
frontal, lateral, and oblique projections. Lateral plate and multi
screw fixation of the distal fibula with additional interfragmentary
screw. Two screws traverse the medial malleolus. Total fluoroscopy
time 16 seconds. Dose 0.45 mGy.
IMPRESSION: Intraoperative fluoroscopy during right ankle ORIF.

## 2022-09-05 ENCOUNTER — Other Ambulatory Visit: Payer: Self-pay | Admitting: Internal Medicine

## 2022-09-05 DIAGNOSIS — E221 Hyperprolactinemia: Secondary | ICD-10-CM

## 2022-09-09 ENCOUNTER — Emergency Department (HOSPITAL_COMMUNITY): Payer: No Typology Code available for payment source

## 2022-09-09 ENCOUNTER — Inpatient Hospital Stay (HOSPITAL_COMMUNITY)
Admission: EM | Admit: 2022-09-09 | Discharge: 2022-09-16 | DRG: 853 | Disposition: A | Discharge status: Home Health | Payer: No Typology Code available for payment source | Attending: Surgery | Admitting: Surgery

## 2022-09-09 ENCOUNTER — Other Ambulatory Visit: Payer: Self-pay

## 2022-09-09 DIAGNOSIS — K219 Gastro-esophageal reflux disease without esophagitis: Secondary | ICD-10-CM

## 2022-09-09 DIAGNOSIS — D352 Benign neoplasm of pituitary gland: Secondary | ICD-10-CM | POA: Diagnosis present

## 2022-09-09 DIAGNOSIS — K5720 Diverticulitis of large intestine with perforation and abscess without bleeding: Secondary | ICD-10-CM | POA: Diagnosis present

## 2022-09-09 DIAGNOSIS — F32A Depression, unspecified: Secondary | ICD-10-CM | POA: Diagnosis present

## 2022-09-09 DIAGNOSIS — R222 Localized swelling, mass and lump, trunk: Secondary | ICD-10-CM

## 2022-09-09 DIAGNOSIS — K631 Perforation of intestine (nontraumatic): Secondary | ICD-10-CM

## 2022-09-09 DIAGNOSIS — F909 Attention-deficit hyperactivity disorder, unspecified type: Secondary | ICD-10-CM | POA: Diagnosis present

## 2022-09-09 DIAGNOSIS — A419 Sepsis, unspecified organism: Secondary | ICD-10-CM | POA: Insufficient documentation

## 2022-09-09 DIAGNOSIS — E785 Hyperlipidemia, unspecified: Secondary | ICD-10-CM | POA: Diagnosis present

## 2022-09-09 DIAGNOSIS — E039 Hypothyroidism, unspecified: Secondary | ICD-10-CM | POA: Diagnosis present

## 2022-09-09 DIAGNOSIS — R911 Solitary pulmonary nodule: Secondary | ICD-10-CM | POA: Diagnosis present

## 2022-09-09 DIAGNOSIS — S36533A Laceration of sigmoid colon, initial encounter: Secondary | ICD-10-CM | POA: Diagnosis present

## 2022-09-09 DIAGNOSIS — N179 Acute kidney failure, unspecified: Secondary | ICD-10-CM | POA: Diagnosis present

## 2022-09-09 DIAGNOSIS — R109 Unspecified abdominal pain: Secondary | ICD-10-CM | POA: Diagnosis not present

## 2022-09-09 DIAGNOSIS — X58XXXA Exposure to other specified factors, initial encounter: Secondary | ICD-10-CM | POA: Diagnosis present

## 2022-09-09 DIAGNOSIS — K567 Ileus, unspecified: Secondary | ICD-10-CM | POA: Diagnosis not present

## 2022-09-09 DIAGNOSIS — K659 Peritonitis, unspecified: Secondary | ICD-10-CM | POA: Diagnosis present

## 2022-09-09 LAB — COMPREHENSIVE METABOLIC PANEL
ALT: 30 U/L (ref 0–44)
AST: 31 U/L (ref 15–41)
Albumin: 4.4 g/dL (ref 3.5–5.0)
Alkaline Phosphatase: 60 U/L (ref 38–126)
Anion gap: 12 (ref 5–15)
BUN: 15 mg/dL (ref 6–20)
CO2: 21 mmol/L — ABNORMAL LOW (ref 22–32)
Calcium: 8.6 mg/dL — ABNORMAL LOW (ref 8.9–10.3)
Chloride: 101 mmol/L (ref 98–111)
Creatinine, Ser: 1.2 mg/dL (ref 0.61–1.24)
GFR, Estimated: >60 mL/min (ref >60)
Glucose, Bld: 130 mg/dL — ABNORMAL HIGH (ref 70–99)
Potassium: 3.7 mmol/L (ref 3.5–5.1)
Sodium: 134 mmol/L — ABNORMAL LOW (ref 135–145)
Total Bilirubin: 1.8 mg/dL — ABNORMAL HIGH (ref 0.3–1.2)
Total Protein: 7.6 g/dL (ref 6.5–8.1)

## 2022-09-09 LAB — CBC WITH DIFFERENTIAL/PLATELET
Abs Immature Granulocytes: 0.06 10*3/uL (ref 0.00–0.07)
Basophils Absolute: 0 10*3/uL (ref 0.0–0.1)
Basophils Relative: 0 %
Eosinophils Absolute: 0 10*3/uL (ref 0.0–0.5)
Eosinophils Relative: 0 %
HCT: 46.7 % (ref 39.0–52.0)
Hemoglobin: 15.7 g/dL (ref 13.0–17.0)
Immature Granulocytes: 0 %
Lymphocytes Relative: 4 %
Lymphs Abs: 0.6 10*3/uL — ABNORMAL LOW (ref 0.7–4.0)
MCH: 28.9 pg (ref 26.0–34.0)
MCHC: 33.6 g/dL (ref 30.0–36.0)
MCV: 86 fL (ref 80.0–100.0)
Monocytes Absolute: 0.7 10*3/uL (ref 0.1–1.0)
Monocytes Relative: 4 %
Neutro Abs: 14.4 10*3/uL — ABNORMAL HIGH (ref 1.7–7.7)
Neutrophils Relative %: 92 %
Platelets: 231 10*3/uL (ref 150–400)
RBC: 5.43 MIL/uL (ref 4.22–5.81)
RDW: 13.2 % (ref 11.5–15.5)
WBC: 15.8 10*3/uL — ABNORMAL HIGH (ref 4.0–10.5)
nRBC: 0 % (ref 0.0–0.2)

## 2022-09-09 LAB — URINALYSIS, ROUTINE W REFLEX MICROSCOPIC
Bacteria, UA: NONE SEEN
Bilirubin Urine: NEGATIVE
Glucose, UA: NEGATIVE mg/dL
Hgb urine dipstick: NEGATIVE
Ketones, ur: 5 mg/dL — AB
Leukocytes,Ua: NEGATIVE
Nitrite: NEGATIVE
Protein, ur: 30 mg/dL — AB
Specific Gravity, Urine: 1.015 (ref 1.005–1.030)
pH: 6 (ref 5.0–8.0)

## 2022-09-09 LAB — MAGNESIUM: Magnesium: 1.7 mg/dL (ref 1.7–2.4)

## 2022-09-09 LAB — LIPASE, BLOOD: Lipase: 36 U/L (ref 11–51)

## 2022-09-09 MED ORDER — IOHEXOL 300 MG/ML  SOLN
100.0000 mL | Freq: Once | INTRAMUSCULAR | Status: AC | PRN
  Administered 2022-09-09: 100 mL via INTRAVENOUS

## 2022-09-09 MED ORDER — ATORVASTATIN CALCIUM 10 MG PO TABS: 10.0000 mg | ORAL_TABLET | Freq: Every evening | ORAL | Status: DC

## 2022-09-09 MED ORDER — BUPROPION HCL ER (XL) 150 MG PO TB24
150.0000 mg | ORAL_TABLET | Freq: Every day | ORAL | Status: DC
  Administered 2022-09-10 – 2022-09-16 (×7): 150 mg via ORAL
  Filled 2022-09-09 (×7): qty 1

## 2022-09-09 MED ORDER — SERTRALINE HCL 100 MG PO TABS
200.0000 mg | ORAL_TABLET | Freq: Every day | ORAL | Status: DC
  Administered 2022-09-10 – 2022-09-15 (×6): 200 mg via ORAL
  Filled 2022-09-09 (×4): qty 2
  Filled 2022-09-09: qty 4
  Filled 2022-09-09: qty 2

## 2022-09-09 MED ORDER — PIPERACILLIN-TAZOBACTAM 3.375 G IVPB 30 MIN
3.3750 g | Freq: Once | INTRAVENOUS | Status: AC
  Administered 2022-09-09: 3.375 g via INTRAVENOUS
  Filled 2022-09-09: qty 50

## 2022-09-09 MED ORDER — PANTOPRAZOLE SODIUM 40 MG IV SOLR
40.0000 mg | INTRAVENOUS | Status: DC
  Administered 2022-09-09 – 2022-09-14 (×6): 40 mg via INTRAVENOUS
  Filled 2022-09-09 (×6): qty 10

## 2022-09-09 MED ORDER — ONDANSETRON HCL 4 MG PO TABS: 4.0000 mg | ORAL_TABLET | Freq: Four times a day (QID) | ORAL | Status: DC | PRN

## 2022-09-09 MED ORDER — LAMOTRIGINE 100 MG PO TABS
100.0000 mg | ORAL_TABLET | Freq: Every day | ORAL | Status: DC
  Administered 2022-09-10 – 2022-09-15 (×6): 100 mg via ORAL
  Filled 2022-09-09 (×6): qty 1

## 2022-09-09 MED ORDER — PIPERACILLIN-TAZOBACTAM 3.375 G IVPB
3.3750 g | Freq: Three times a day (TID) | INTRAVENOUS | Status: AC
  Administered 2022-09-10 – 2022-09-15 (×18): 3.375 g via INTRAVENOUS
  Filled 2022-09-09 (×18): qty 50

## 2022-09-09 MED ORDER — ONDANSETRON HCL 4 MG/2ML IJ SOLN
4.0000 mg | Freq: Once | INTRAMUSCULAR | Status: AC
  Administered 2022-09-09: 4 mg via INTRAVENOUS
  Filled 2022-09-09: qty 2

## 2022-09-09 MED ORDER — HYDROMORPHONE HCL 1 MG/ML IJ SOLN
1.0000 mg | Freq: Once | INTRAMUSCULAR | Status: AC
  Administered 2022-09-09: 1 mg via INTRAVENOUS
  Filled 2022-09-09: qty 1

## 2022-09-09 MED ORDER — LACTATED RINGERS IV BOLUS
1000.0000 mL | Freq: Once | INTRAVENOUS | Status: AC
  Administered 2022-09-09: 1000 mL via INTRAVENOUS

## 2022-09-09 MED ORDER — HYDROMORPHONE HCL 1 MG/ML IJ SOLN
0.5000 mg | INTRAMUSCULAR | Status: DC | PRN
  Administered 2022-09-10 (×4): 1 mg via INTRAVENOUS
  Filled 2022-09-09 (×4): qty 1

## 2022-09-09 MED ORDER — ONDANSETRON HCL 4 MG/2ML IJ SOLN
4.0000 mg | Freq: Four times a day (QID) | INTRAMUSCULAR | Status: DC | PRN
  Administered 2022-09-10: 4 mg via INTRAVENOUS
  Filled 2022-09-09 (×2): qty 2

## 2022-09-09 MED ORDER — LACTATED RINGERS IV SOLN: INTRAVENOUS | Status: DC

## 2022-09-09 MED ORDER — LEVOTHYROXINE SODIUM 88 MCG PO TABS
88.0000 ug | ORAL_TABLET | Freq: Every day | ORAL | Status: DC
  Administered 2022-09-10 – 2022-09-16 (×6): 88 ug via ORAL
  Filled 2022-09-09 (×6): qty 1

## 2022-09-09 NOTE — H&P (Signed)
History and Physical    Patient: George Cooley ZOX:[id number] DOB: 12/20/1980 DOA: 09/09/2022 DOS: the patient was seen and examined on 09/09/2022 PCP: Nonnie Done., MD  Patient coming from: Home - lives with his wife and son    Chief Complaint: acute abdominal pain   HPI: George Cooley is a 42 y.o. male with medical history significant of ADHD, hypothyroidism, HLD, depression, hypo testosterone, pituitary microadenoma who presented to ED with complaints of acute abdominal pain shortly after placing an enema around 130 this afternoon. He states he has been doing this x15-20 years, once to twice a day. He has a hose connected to his shower and a silicone tube that goes into his rectum.  He states after gave himself his enema he had a sharp and excruciating pain in his LLQ and then cramping of his abdominal muscles. His wife then massaged his stomach and this seemed to help as well as laying down; however, the pain continued to progress and became severe so came to Ed.  Pain rated as a 10/10, described as sharp. It radiated to the entire abdomen. He has had a lot of nausea and only one episode of vomiting. No fevers at home. His last normal BM was yesterday evening. He has not had any flatus. He has had some liquid (mainly water) come out. He has had a lot of burping.   He has no history of diverticulitis/colonoscopy.    He has been feeling good. Denies any fever/chills, vision changes/headaches, chest pain or palpitations, shortness of breath or cough, dysuria or leg swelling.    He does not smoke or drink alcohol.   ER Course:  vitals: afebrile, bp: 104/75, HR; 95, RR: 20, oxygen: 95%RA Pertinent labs: WBC: 15.8, t.bili: 1.8,  Abdominal xray: negative CT abdomen: There are few small pockets of air lying immediately adjacent to the rectosigmoid suggesting possible contained perforation. This may be due to trauma or related to colitis or diverticulitis. There is diffuse wall thickening  in rectosigmoid. Scattered diverticula are seen in colon. There is small amount of serous fluid in pelvic cavity. There is no demonstrable large intraperitoneal or retroperitoneal hematoma. Surgical consultation should be considered.   There is no hydronephrosis. There is no evidence of intestinal obstruction or pneumoperitoneum in the upper abdomen.   There is fluid in the lumen of lower thoracic esophagus suggesting gastroesophageal reflux. There is there is mild diffuse wall thickening in the urinary bladder which may be due to incomplete distention or suggest cystitis.   There is 10 mm pleural-based nodule in left lower lobe. Follow-up CT chest in 3 months may be considered. There are linear densities in the posterior lower lung fields suggesting possible subsegmental atelectasis. In ED: started on zosyn, given IVF, pain medication. General surgery consulted and [facility] asked to admit.     Review of Systems: As mentioned in the history of present illness. All other systems reviewed and are negative.  Social History:  has no history on file for tobacco use, alcohol use, and drug use.  No Known Allergies  No family history on file.  Prior to Admission medications   Not on File    Physical Exam: Vitals:   09/09/22 1815 09/09/22 1830 09/09/22 1845 09/09/22 1850  BP:  104/75 113/75   Pulse:  95 94 100  Resp: 18 20 (!) 22   Temp:  97.8 F (36.6 C)    TempSrc:  Oral    SpO2:  95% 94% 93%  General:  Appears calm and comfortable and is in NAD Eyes:  PERRL, EOMI, normal lids, iris ENT:  grossly normal hearing, lips & tongue, dry mucous membranes; appropriate dentition Neck:  no LAD, masses or thyromegaly; no carotid bruits Cardiovascular:  RRR, no m/r/g. No LE edema.  Respiratory:   CTA bilaterally with no wheezes/rales/rhonchi.  Normal respiratory effort. Abdomen:  soft, TTP in left lower quadrant and supra pubic area. BS+.  Back:   normal alignment, no CVAT Skin:  no  rash or induration seen on limited exam Musculoskeletal:  grossly normal tone BUE/BLE, good ROM, no bony abnormality Lower extremity:  No LE edema.  Limited foot exam with no ulcerations.  2+ distal pulses. Psychiatric:  grossly normal mood and affect, speech fluent and appropriate, AOx3 Neurologic:  CN 2-12 grossly intact, moves all extremities in coordinated fashion, sensation intact   Radiological Exams on Admission: Independently reviewed - see discussion in A/P where applicable  CT ABDOMEN PELVIS W CONTRAST  Result Date: 09/09/2022 CLINICAL DATA:  Lower abdominal pain EXAM: CT ABDOMEN AND PELVIS WITH CONTRAST TECHNIQUE: Multidetector CT imaging of the abdomen and pelvis was performed using the standard protocol following bolus administration of intravenous contrast. RADIATION DOSE REDUCTION: This exam was performed according to the departmental dose-optimization program which includes automated exposure control, adjustment of the mA and/or kV according to patient size and/or use of iterative reconstruction technique. CONTRAST:  OMNIPAQUE IOHEXOL 300 MG/ML  SOLN COMPARISON:  None Available. FINDINGS: Lower chest: There is crowding of markings in the posterior lower lung fields, possibly subsegmental atelectasis. In image 23 of series 5, there is 10 x 7 mm pleural-based nodule in the lateral left lower lung field in left lower lobe. Hepatobiliary: No focal abnormalities are seen in liver. Gallbladder is unremarkable. Pancreas: No focal abnormalities are seen. Spleen: Unremarkable. Adrenals/Urinary Tract: Adrenals are unremarkable. There is no hydronephrosis. There are no renal or ureteral stones. There is mild diffuse wall thickening in the urinary bladder. Stomach/Bowel: There is fluid in the lumen of lower thoracic esophagus suggesting gastroesophageal reflux. Stomach is moderately distended. Small bowel loops are not dilated. The appendix is not dilated. There is fluid in the lumen of  ascending colon. Scattered diverticula are seen in colon. There is mild diffuse wall thickening in rectosigmoid. There are few small pockets of air adjacent to the sigmoid colon in midline, possibly contained bowel perforation and extraluminal air due to trauma or acute diverticulitis or acute colitis. There is 4.9 x 3 cm serous fluid collection in the posterior pelvic cavity adjacent to the rectosigmoid. Vascular/Lymphatic: No acute findings are seen in aorta and its major branches. There are vascular coils in the course of left gonadal vein. Small scattered arterial calcifications are seen. Reproductive: Prostate is slightly enlarged. Other: There is no pneumoperitoneum in the upper abdomen. Small pockets of air adjacent to the rectosigmoid may suggest contained perforation. Bilateral inguinal hernias containing fat are seen, larger on the left side. Musculoskeletal: No acute findings are seen. IMPRESSION: There are few small pockets of air lying immediately adjacent to the rectosigmoid suggesting possible contained perforation. This may be due to trauma or related to colitis or diverticulitis. There is diffuse wall thickening in rectosigmoid. Scattered diverticula are seen in colon. There is small amount of serous fluid in pelvic cavity. There is no demonstrable large intraperitoneal or retroperitoneal hematoma. Surgical consultation should be considered. There is no hydronephrosis. There is no evidence of intestinal obstruction or pneumoperitoneum in the upper abdomen. There is  fluid in the lumen of lower thoracic esophagus suggesting gastroesophageal reflux. There is there is mild diffuse wall thickening in the urinary bladder which may be due to incomplete distention or suggest cystitis. There is 10 mm pleural-based nodule in left lower lobe. Follow-up CT chest in 3 months may be considered. There are linear densities in the posterior lower lung fields suggesting possible subsegmental atelectasis. Imaging  finding of possible contained perforation in rectosigmoid was relayed to patient's provider [facility] by telephone call. Electronically Signed   By: Ernie Avena M.D.   On: 09/09/2022 20:24   DG Abd Portable 1V  Result Date: 09/09/2022 CLINICAL DATA:  Pain, evaluate for free air. EXAM: PORTABLE ABDOMEN - 1 VIEW COMPARISON:  None Available. FINDINGS: The bowel gas pattern is normal. No free air identified. No radio-opaque calculi calculi. Embolic coils are seen in the left abdomen. IMPRESSION: Nonobstructive bowel gas pattern. Electronically Signed   By: Darliss Cheney M.D.   On: 09/09/2022 19:23      Labs on Admission: I have personally reviewed the available labs and imaging studies at the time of the admission.  Pertinent labs:   WBC: 15.8, t.bili: 1.8,  Assessment and Plan: Principal Problem:   sespsis secondary to perorated rectosigmoid colon Active Problems:   Pleural nodule   Depressive disorder   Pituitary microadenoma (HCC)   GERD (gastroesophageal reflux disease)   Attention deficit hyperactivity disorder (ADHD)   Sepsis (HCC)    Assessment and Plan: * sespsis secondary to perorated rectosigmoid colon 42  year old male presenting with acute onset of abdominal pain shortly after placing an enema found to have possible contained perforation in the rectosigmoid colon due to trauma from enema or diverticulitis/colitis meeting sepsis criteria with leukocytosis and tachycardia.  -obs to med-surg -likely secondary to trauma from enema -bolused in ED, continue IVF -NPO except for ice chips -continue IV abx with zosyn  -check BC and lactic acid -pain control and anti-emetics -general surgery consulted and will see- not a surgical candidate at this time   Pleural nodule Incidental finding on CT: 10mm pleural-based nodule in LLL.  Recommend f/u CT chest in 3 months   Depressive disorder He is on zoloft, wellbutrin and lamotrigine  Pituitary microadenoma (HCC) On  cabergoline .25mg  twice/week   GERD (gastroesophageal reflux disease) On famotidine BID, will give him PPI IV   Attention deficit hyperactivity disorder (ADHD) On Adzneyx QAM Will hold while inpatient     Advance Care Planning:   Code Status: Full Code   Consults: general surgery: Dr. Dwain Sarna   DVT Prophylaxis: SCDs/ambulation   Family Communication: wife and son at beside   Severity of Illness: The appropriate patient status for this patient is OBSERVATION. Observation status is judged to be reasonable and necessary in order to provide the required intensity of service to ensure the patient's safety. The patient's presenting symptoms, physical exam findings, and initial radiographic and laboratory data in the context of their medical condition is felt to place them at decreased risk for further clinical deterioration. Furthermore, it is anticipated that the patient will be medically stable for discharge from the hospital within 2 midnights of admission.   Author: Orland Mustard, MD 09/09/2022 10:15 PM  For on call review [email or web address].

## 2022-09-09 NOTE — Assessment & Plan Note (Addendum)
42  year old male presenting with acute onset of abdominal pain shortly after placing an enema found to have possible contained perforation in the rectosigmoid colon due to trauma from enema or diverticulitis/colitis meeting sepsis criteria with leukocytosis and tachycardia.  -obs to med-surg -likely secondary to trauma from enema -bolused in ED, continue IVF -NPO except for ice chips -continue IV abx with zosyn  -check BC and lactic acid -pain control and anti-emetics -general surgery consulted and will see- not a surgical candidate at this time

## 2022-09-09 NOTE — Progress Notes (Signed)
Pharmacy Antibiotic Note  George Cooley is a 42 y.o. male admitted on 09/09/2022 with  Contained perforation of rectosigmoid .  Pharmacy has been consulted for Zosyn dosing.  Plan: Zosyn 3.375g IV q8h (4 hour infusion). No dose adjustments anticipated.  Pharmacy will sign off and monitor peripherally via electronic surveillance software for any changes in renal function or micro data.      Temp (24hrs), Avg:97.8 F (36.6 C), Min:97.8 F (36.6 C), Max:97.8 F (36.6 C)  Recent Labs  Lab 09/09/22 1810  WBC 15.8*  CREATININE 1.20    CrCl cannot be calculated (Unknown ideal weight.).    No Known Allergies   Thank you for allowing pharmacy to be a part of this patient's care.  Junita Push PharmD 09/09/2022 10:04 PM

## 2022-09-09 NOTE — Assessment & Plan Note (Addendum)
He is on zoloft, wellbutrin and lamotrigine

## 2022-09-09 NOTE — ED Triage Notes (Signed)
C/o sharp lower abd pain x4 hrs after using an enema. Tender on palpitation. Emesis x1  Denies BM or urination since pain started No abd distention noted.

## 2022-09-09 NOTE — ED Provider Notes (Signed)
[place] EMERGENCY DEPARTMENT AT [facility] Provider Note   CSN: [id number] Arrival date & time: 09/09/22  1715     History  Chief Complaint  Patient presents with   Abdominal Pain    George Cooley is a 42 y.o. male.   Abdominal Pain    Patient is a 42 year old male presenting to the emergency department due to abdominal pain.  Patient was doing an enema around 130 today, start having severe abdominal pain afterwards which has been diffuse, worse to the left lower quadrant.  Associated with nausea 1 episode of emesis.  Patient has never had previous abdominal surgeries, no history of colonoscopies.  States she has been giving himself enemas 1-2 times daily for the last 15 years without any complication until today.  No dysuria or hematuria.  No medicine prior to arrival.    Home Medications Prior to Admission medications   Not on File      Allergies    Patient has no known allergies.    Review of Systems   Review of Systems  Gastrointestinal:  Positive for abdominal pain.    Physical Exam Updated Vital Signs BP 113/75   Pulse 100   Temp 97.8 F (36.6 C) (Oral)   Resp (!) 22   SpO2 93%  Physical Exam Vitals and nursing note reviewed. Exam conducted with a chaperone present.  Constitutional:      Appearance: Normal appearance.  HENT:     Head: Normocephalic and atraumatic.  Eyes:     General: No scleral icterus.       Right eye: No discharge.        Left eye: No discharge.     Extraocular Movements: Extraocular movements intact.     Pupils: Pupils are equal, round, and reactive to light.  Cardiovascular:     Rate and Rhythm: Regular rhythm. Tachycardia present.     Pulses: Normal pulses.     Heart sounds: Normal heart sounds. No murmur heard.    No friction rub. No gallop.  Pulmonary:     Effort: Pulmonary effort is normal. No respiratory distress.     Breath sounds: Normal breath sounds.  Abdominal:     General: Abdomen is flat. Bowel  sounds are normal. There is no distension.     Palpations: Abdomen is soft.     Tenderness: There is abdominal tenderness.     Comments: Abdomen slightly distended with diffuse tenderness and guarding  Skin:    General: Skin is warm and dry.     Coloration: Skin is pale. Skin is not jaundiced.  Neurological:     Mental Status: He is alert. Mental status is at baseline.     Coordination: Coordination normal.     ED Results / Procedures / Treatments   Labs (all labs ordered are listed, but only abnormal results are displayed) Labs Reviewed  CBC WITH DIFFERENTIAL/PLATELET - Abnormal; Notable for the following components:      Result Value   WBC 15.8 (*)    Neutro Abs 14.4 (*)    Lymphs Abs 0.6 (*)    All other components within normal limits  COMPREHENSIVE METABOLIC PANEL - Abnormal; Notable for the following components:   Sodium 134 (*)    CO2 21 (*)    Glucose, Bld 130 (*)    Calcium 8.6 (*)    Total Bilirubin 1.8 (*)    All other components within normal limits  URINALYSIS, ROUTINE W REFLEX MICROSCOPIC - Abnormal; Notable  for the following components:   Ketones, ur 5 (*)    Protein, ur 30 (*)    All other components within normal limits  LIPASE, BLOOD    EKG None  Radiology CT ABDOMEN PELVIS W CONTRAST  Result Date: 09/09/2022 CLINICAL DATA:  Lower abdominal pain EXAM: CT ABDOMEN AND PELVIS WITH CONTRAST TECHNIQUE: Multidetector CT imaging of the abdomen and pelvis was performed using the standard protocol following bolus administration of intravenous contrast. RADIATION DOSE REDUCTION: This exam was performed according to the departmental dose-optimization program which includes automated exposure control, adjustment of the mA and/or kV according to patient size and/or use of iterative reconstruction technique. CONTRAST:  OMNIPAQUE IOHEXOL 300 MG/ML  SOLN COMPARISON:  None Available. FINDINGS: Lower chest: There is crowding of markings in the posterior lower lung  fields, possibly subsegmental atelectasis. In image 23 of series 5, there is 10 x 7 mm pleural-based nodule in the lateral left lower lung field in left lower lobe. Hepatobiliary: No focal abnormalities are seen in liver. Gallbladder is unremarkable. Pancreas: No focal abnormalities are seen. Spleen: Unremarkable. Adrenals/Urinary Tract: Adrenals are unremarkable. There is no hydronephrosis. There are no renal or ureteral stones. There is mild diffuse wall thickening in the urinary bladder. Stomach/Bowel: There is fluid in the lumen of lower thoracic esophagus suggesting gastroesophageal reflux. Stomach is moderately distended. Small bowel loops are not dilated. The appendix is not dilated. There is fluid in the lumen of ascending colon. Scattered diverticula are seen in colon. There is mild diffuse wall thickening in rectosigmoid. There are few small pockets of air adjacent to the sigmoid colon in midline, possibly contained bowel perforation and extraluminal air due to trauma or acute diverticulitis or acute colitis. There is 4.9 x 3 cm serous fluid collection in the posterior pelvic cavity adjacent to the rectosigmoid. Vascular/Lymphatic: No acute findings are seen in aorta and its major branches. There are vascular coils in the course of left gonadal vein. Small scattered arterial calcifications are seen. Reproductive: Prostate is slightly enlarged. Other: There is no pneumoperitoneum in the upper abdomen. Small pockets of air adjacent to the rectosigmoid may suggest contained perforation. Bilateral inguinal hernias containing fat are seen, larger on the left side. Musculoskeletal: No acute findings are seen. IMPRESSION: There are few small pockets of air lying immediately adjacent to the rectosigmoid suggesting possible contained perforation. This may be due to trauma or related to colitis or diverticulitis. There is diffuse wall thickening in rectosigmoid. Scattered diverticula are seen in colon. There is  small amount of serous fluid in pelvic cavity. There is no demonstrable large intraperitoneal or retroperitoneal hematoma. Surgical consultation should be considered. There is no hydronephrosis. There is no evidence of intestinal obstruction or pneumoperitoneum in the upper abdomen. There is fluid in the lumen of lower thoracic esophagus suggesting gastroesophageal reflux. There is there is mild diffuse wall thickening in the urinary bladder which may be due to incomplete distention or suggest cystitis. There is 10 mm pleural-based nodule in left lower lobe. Follow-up CT chest in 3 months may be considered. There are linear densities in the posterior lower lung fields suggesting possible subsegmental atelectasis. Imaging finding of possible contained perforation in rectosigmoid was relayed to patient's provider [facility] by telephone call. Electronically Signed   By: Ernie Avena M.D.   On: 09/09/2022 20:24   DG Abd Portable 1V  Result Date: 09/09/2022 CLINICAL DATA:  Pain, evaluate for free air. EXAM: PORTABLE ABDOMEN - 1 VIEW COMPARISON:  None Available. FINDINGS:  The bowel gas pattern is normal. No free air identified. No radio-opaque calculi calculi. Embolic coils are seen in the left abdomen. IMPRESSION: Nonobstructive bowel gas pattern. Electronically Signed   By: Darliss Cheney M.D.   On: 09/09/2022 19:23    Procedures Procedures    Medications Ordered in ED Medications  piperacillin-tazobactam (ZOSYN) IVPB 3.375 g (has no administration in time range)  lactated ringers bolus 1,000 mL (has no administration in time range)  HYDROmorphone (DILAUDID) injection 1 mg (has no administration in time range)  HYDROmorphone (DILAUDID) injection 1 mg (1 mg Intravenous Given 09/09/22 1830)  ondansetron (ZOFRAN) injection 4 mg (4 mg Intravenous Given 09/09/22 1830)  HYDROmorphone (DILAUDID) injection 1 mg (1 mg Intravenous Given 09/09/22 1859)  iohexol (OMNIPAQUE) 300 MG/ML solution 100 mL (100 mLs  Intravenous Contrast Given 09/09/22 1935)    ED Course/ Medical Decision Making/ A&P                              Medical Decision Making Amount and/or Complexity of Data Reviewed Radiology: ordered.  Risk Prescription drug management. Decision regarding hospitalization.   Patient presents to the emergency department due to abdominal pain.  Differential includes perforation, diverticulitis, colitis.  Patient's wife is bedside providing dependent history.  I am concerned about perforated bowel given in setting of pressurized enema with severe abdominal pain guarding on exam.  Will proceed with labs, CT abdomen.  Narcotic pain medicine and fluids ordered.    CBC with leukocytosis with left shift.  No gross electrolyte derangement or AKI, UA unremarkable.  CT abdomen is notable for colitis with contained perforation and rectosigmoid colon secondary to trauma versus colitis.  I consulted general surgery and spoke with Dr. Dwain Sarna.  Would recommend treating as a perforated diverticulitis with antibiotics, he will consult on the patient and recommending medicine admission.  Zosyn ordered.  I will consult the hospitalist for admission.        Final Clinical Impression(s) / ED Diagnoses Final diagnoses:  Perforated sigmoid colon [facility])    Rx / DC Orders ED Discharge Orders     None         Theron Arista, Cordelia Poche 09/09/22 2119    Rolan Bucco, MD 09/09/22 2159

## 2022-09-09 NOTE — Assessment & Plan Note (Signed)
On Adzneyx QAM Will hold while inpatient

## 2022-09-09 NOTE — ED Provider Triage Note (Signed)
Emergency Medicine Provider Triage Evaluation Note  George Cooley , a 42 y.o. male  was evaluated in triage.  Pt complains of sharp lower abdominal pain that began today at 1:30 PM.  Patient states he was using an enema and all of a sudden started having lower abdominal pain.  Patient states his abdomen does not look distended but is extremely tender to palpation.  Patient states he had 1 episode of emesis that was nonbloody but is nauseous.  Patient not had a bowel movement or urinated since.  Patient denies chest pain, shortness of breath, change in sensation/motor skills, back pain  Review of Systems  Positive: See HPI Negative: See HPI  Physical Exam  There were no vitals taken for this visit. Gen:   Awake, distress Resp:  Normal effort  MSK:   Moves extremities without difficulty  Other:  Tenderness and guarding noted in lower abdominal region, soft abdomen, no overlying skin color changes, sensation distally  Medical Decision Making  Medically screening exam initiated at 5:24 PM.  Appropriate orders placed.  George Cooley was informed that the remainder of the evaluation will be completed by another provider, this initial triage assessment does not replace that evaluation, and the importance of remaining in the ED until their evaluation is complete.  Workup initiated, nurse notified that patient will need to be roomed next, patient blood pressures high 90s low 100s but stable in triage   Netta Corrigan, PA-C 09/09/22 1731

## 2022-09-09 NOTE — Assessment & Plan Note (Signed)
On famotidine BID, will give him PPI IV

## 2022-09-09 NOTE — Consult Note (Signed)
Reason for Consult:ab pain Referring Physician: Theron Arista PA  Alvira Philips Pustejovsky is an 42 y.o. male.  HPI: 59 yom who ha depression and adhd uses enemas daily that are pressurized in the shower for enjoyment. They are just with water. No issue previously.  Today about 130 he developed abdominal pain while administering one. He does not normally have issues with constipation. He has passed some liquid since then.  Voiding fine.  Some nausea.  He had a ct scan that looks like a contained perforation. There is diffuse wall thickening of rectosigmoid which I think is likely chronic. There are some pockets of air.   I was asked to see him  Pmh depression, adhd  No family history on file.  Social History:  has no history on file for tobacco use, alcohol use, and drug use.  Allergies: No Known Allergies  No current facility-administered medications for this encounter.   No current outpatient medications on file.   d  Results for orders placed or performed during the hospital encounter of 09/09/22 (from the past 48 hour(s))  Urinalysis, Routine w reflex microscopic -Urine, Clean Catch     Status: Abnormal   Collection Time: 09/09/22  5:28 PM  Result Value Ref Range   Color, Urine YELLOW YELLOW   APPearance CLEAR CLEAR   Specific Gravity, Urine 1.015 1.005 - 1.030   pH 6.0 5.0 - 8.0   Glucose, UA NEGATIVE NEGATIVE mg/dL   Hgb urine dipstick NEGATIVE NEGATIVE   Bilirubin Urine NEGATIVE NEGATIVE   Ketones, ur 5 (A) NEGATIVE mg/dL   Protein, ur 30 (A) NEGATIVE mg/dL   Nitrite NEGATIVE NEGATIVE   Leukocytes,Ua NEGATIVE NEGATIVE   RBC / HPF 0-5 0 - 5 RBC/hpf   WBC, UA 0-5 0 - 5 WBC/hpf   Bacteria, UA NONE SEEN NONE SEEN   Squamous Epithelial / HPF 0-5 0 - 5 /HPF   Mucus PRESENT    Hyaline Casts, UA PRESENT     Comment: Performed at [facility], [address] [place], [place] [address]  CBC with Differential     Status: Abnormal   Collection Time: 09/09/22  6:10 PM   Result Value Ref Range   WBC 15.8 (H) 4.0 - 10.5 K/uL   RBC 5.43 4.22 - 5.81 MIL/uL   Hemoglobin 15.7 13.0 - 17.0 g/dL   HCT 60.4 54.0 - 98.1 %   MCV 86.0 80.0 - 100.0 fL   MCH 28.9 26.0 - 34.0 pg   MCHC 33.6 30.0 - 36.0 g/dL   RDW 19.1 47.8 - 29.5 %   Platelets 231 150 - 400 K/uL   nRBC 0.0 0.0 - 0.2 %   Neutrophils Relative % 92 %   Neutro Abs 14.4 (H) 1.7 - 7.7 K/uL   Lymphocytes Relative 4 %   Lymphs Abs 0.6 (L) 0.7 - 4.0 K/uL   Monocytes Relative 4 %   Monocytes Absolute 0.7 0.1 - 1.0 K/uL   Eosinophils Relative 0 %   Eosinophils Absolute 0.0 0.0 - 0.5 K/uL   Basophils Relative 0 %   Basophils Absolute 0.0 0.0 - 0.1 K/uL   Immature Granulocytes 0 %   Abs Immature Granulocytes 0.06 0.00 - 0.07 K/uL    Comment: Performed at [facility], [address] [place], [place] [address]  Comprehensive metabolic panel     Status: Abnormal   Collection Time: 09/09/22  6:10 PM  Result Value Ref Range   Sodium 134 (L) 135 -  145 mmol/L   Potassium 3.7 3.5 - 5.1 mmol/L   Chloride 101 98 - 111 mmol/L   CO2 21 (L) 22 - 32 mmol/L   Glucose, Bld 130 (H) 70 - 99 mg/dL    Comment: Glucose reference range applies only to samples taken after fasting for at least 8 hours.   BUN 15 6 - 20 mg/dL   Creatinine, Ser 1.61 0.61 - 1.24 mg/dL   Calcium 8.6 (L) 8.9 - 10.3 mg/dL   Total Protein 7.6 6.5 - 8.1 g/dL   Albumin 4.4 3.5 - 5.0 g/dL   AST 31 15 - 41 U/L   ALT 30 0 - 44 U/L   Alkaline Phosphatase 60 38 - 126 U/L   Total Bilirubin 1.8 (H) 0.3 - 1.2 mg/dL   GFR, Estimated >09 >60 mL/min    Comment: (NOTE) Calculated using the CKD-EPI Creatinine Equation (2021)    Anion gap 12 5 - 15    Comment: Performed at [facility], [address] [place], [place] [address]  Lipase, blood     Status: None   Collection Time: 09/09/22  6:10 PM  Result Value Ref Range   Lipase 36 11 - 51 U/L    Comment: Performed at [facility], [address], [place], [place] [address]    CT ABDOMEN PELVIS W CONTRAST  Result Date: 09/09/2022 CLINICAL DATA:  Lower abdominal pain EXAM: CT ABDOMEN AND PELVIS WITH CONTRAST TECHNIQUE: Multidetector CT imaging of the abdomen and pelvis was performed using the standard protocol following bolus administration of intravenous contrast. RADIATION DOSE REDUCTION: This exam was performed according to the departmental dose-optimization program which includes automated exposure control, adjustment of the mA and/or kV according to patient size and/or use of iterative reconstruction technique. CONTRAST:  OMNIPAQUE IOHEXOL 300 MG/ML  SOLN COMPARISON:  None Available. FINDINGS: Lower chest: There is crowding of markings in the posterior lower lung fields, possibly subsegmental atelectasis. In image 23 of series 5, there is 10 x 7 mm pleural-based nodule in the lateral left lower lung field in left lower lobe. Hepatobiliary: No focal abnormalities are seen in liver. Gallbladder is unremarkable. Pancreas: No focal abnormalities are seen. Spleen: Unremarkable. Adrenals/Urinary Tract: Adrenals are unremarkable. There is no hydronephrosis. There are no renal or ureteral stones. There is mild diffuse wall thickening in the urinary bladder. Stomach/Bowel: There is fluid in the lumen of lower thoracic esophagus suggesting gastroesophageal reflux. Stomach is moderately distended. Small bowel loops are not dilated. The appendix is not dilated. There is fluid in the lumen of ascending colon. Scattered diverticula are seen in colon. There is mild diffuse wall thickening in rectosigmoid. There are few small pockets of air adjacent to the sigmoid colon in midline, possibly contained bowel perforation and extraluminal air due to trauma or acute diverticulitis or acute colitis. There is 4.9 x 3 cm serous fluid collection in the posterior pelvic cavity adjacent to the rectosigmoid. Vascular/Lymphatic: No acute findings are seen in aorta  and its major branches. There are vascular coils in the course of left gonadal vein. Small scattered arterial calcifications are seen. Reproductive: Prostate is slightly enlarged. Other: There is no pneumoperitoneum in the upper abdomen. Small pockets of air adjacent to the rectosigmoid may suggest contained perforation. Bilateral inguinal hernias containing fat are seen, larger on the left side. Musculoskeletal: No acute findings are seen. IMPRESSION: There are few small pockets of air lying immediately adjacent to the rectosigmoid suggesting possible contained perforation. This  may be due to trauma or related to colitis or diverticulitis. There is diffuse wall thickening in rectosigmoid. Scattered diverticula are seen in colon. There is small amount of serous fluid in pelvic cavity. There is no demonstrable large intraperitoneal or retroperitoneal hematoma. Surgical consultation should be considered. There is no hydronephrosis. There is no evidence of intestinal obstruction or pneumoperitoneum in the upper abdomen. There is fluid in the lumen of lower thoracic esophagus suggesting gastroesophageal reflux. There is there is mild diffuse wall thickening in the urinary bladder which may be due to incomplete distention or suggest cystitis. There is 10 mm pleural-based nodule in left lower lobe. Follow-up CT chest in 3 months may be considered. There are linear densities in the posterior lower lung fields suggesting possible subsegmental atelectasis. Imaging finding of possible contained perforation in rectosigmoid was relayed to patient's provider [facility] by telephone call. Electronically Signed   By: Ernie Avena M.D.   On: 09/09/2022 20:24   DG Abd Portable 1V  Result Date: 09/09/2022 CLINICAL DATA:  Pain, evaluate for free air. EXAM: PORTABLE ABDOMEN - 1 VIEW COMPARISON:  None Available. FINDINGS: The bowel gas pattern is normal. No free air identified. No radio-opaque calculi calculi. Embolic coils are  seen in the left abdomen. IMPRESSION: Nonobstructive bowel gas pattern. Electronically Signed   By: Darliss Cheney M.D.   On: 09/09/2022 19:23    Review of Systems  Constitutional:  Negative for fever.  Gastrointestinal:  Positive for abdominal pain and nausea. Negative for blood in stool and vomiting.  All other systems reviewed and are negative.  Blood pressure 113/75, pulse 100, temperature 97.8 F (36.6 C), temperature source Oral, resp. rate (!) 22, SpO2 93 %. Physical Exam Vitals reviewed.  Constitutional:      Appearance: He is well-developed.  Eyes:     General: No scleral icterus. Cardiovascular:     Rate and Rhythm: Tachycardia present.  Pulmonary:     Effort: Pulmonary effort is normal.  Abdominal:     General: There is no distension.     Palpations: Abdomen is soft.     Tenderness: There is abdominal tenderness in the suprapubic area and left lower quadrant.     Hernia: A hernia is present. Hernia is present in the umbilical area.  Neurological:     Mental Status: He is alert.   Assessment/Plan: Contained perforation of rectosigmoid secondary to enema usage  -I dont think he currently needs surgery. Hopefully this is something small that will heal conservatively.  Can see if IR can drain collection but not sure they can.  Would do npo, he can have his meds with sips, zosyn and we will follow with you -I discussed diversion and drainage with he and his wife if it does not get better or he becomes ill -lovenox is fine for prophylaxis  I reviewed nursing notes, ED provider notes, last 24 h vitals and pain scores, last 24 h labs and trends, and last 24 h imaging results. I reviewed CT scan and this shows a fluid collection in pelvis as well as what appears to be contained perforation.  This care required moderate level of medical decision making.    Emelia Loron 09/09/2022, 9:39 PM

## 2022-09-09 NOTE — Assessment & Plan Note (Signed)
On cabergoline .25mg  twice/week

## 2022-09-09 NOTE — Assessment & Plan Note (Signed)
Incidental finding on CT: 10mm pleural-based nodule in LLL.  Recommend f/u CT chest in 3 months

## 2022-09-10 ENCOUNTER — Observation Stay (HOSPITAL_COMMUNITY): Payer: No Typology Code available for payment source

## 2022-09-10 ENCOUNTER — Inpatient Hospital Stay (HOSPITAL_COMMUNITY): Payer: No Typology Code available for payment source | Admitting: Certified Registered"

## 2022-09-10 ENCOUNTER — Encounter (HOSPITAL_COMMUNITY): Payer: Self-pay | Admitting: Family Medicine

## 2022-09-10 ENCOUNTER — Encounter (HOSPITAL_COMMUNITY): Admission: EM | Disposition: A | Discharge status: Home Health | Payer: Self-pay | Source: Home / Self Care

## 2022-09-10 DIAGNOSIS — K5720 Diverticulitis of large intestine with perforation and abscess without bleeding: Secondary | ICD-10-CM | POA: Diagnosis present

## 2022-09-10 DIAGNOSIS — K631 Perforation of intestine (nontraumatic): Secondary | ICD-10-CM

## 2022-09-10 DIAGNOSIS — F909 Attention-deficit hyperactivity disorder, unspecified type: Secondary | ICD-10-CM | POA: Diagnosis present

## 2022-09-10 DIAGNOSIS — R109 Unspecified abdominal pain: Secondary | ICD-10-CM | POA: Diagnosis present

## 2022-09-10 DIAGNOSIS — K659 Peritonitis, unspecified: Secondary | ICD-10-CM | POA: Diagnosis present

## 2022-09-10 DIAGNOSIS — E039 Hypothyroidism, unspecified: Secondary | ICD-10-CM | POA: Diagnosis present

## 2022-09-10 DIAGNOSIS — F32A Depression, unspecified: Secondary | ICD-10-CM | POA: Diagnosis present

## 2022-09-10 DIAGNOSIS — E785 Hyperlipidemia, unspecified: Secondary | ICD-10-CM | POA: Diagnosis present

## 2022-09-10 DIAGNOSIS — S36533A Laceration of sigmoid colon, initial encounter: Secondary | ICD-10-CM | POA: Diagnosis present

## 2022-09-10 DIAGNOSIS — N179 Acute kidney failure, unspecified: Secondary | ICD-10-CM | POA: Diagnosis present

## 2022-09-10 DIAGNOSIS — R911 Solitary pulmonary nodule: Secondary | ICD-10-CM | POA: Diagnosis present

## 2022-09-10 DIAGNOSIS — K219 Gastro-esophageal reflux disease without esophagitis: Secondary | ICD-10-CM | POA: Diagnosis present

## 2022-09-10 DIAGNOSIS — K567 Ileus, unspecified: Secondary | ICD-10-CM | POA: Diagnosis not present

## 2022-09-10 DIAGNOSIS — A419 Sepsis, unspecified organism: Secondary | ICD-10-CM | POA: Diagnosis present

## 2022-09-10 DIAGNOSIS — X58XXXA Exposure to other specified factors, initial encounter: Secondary | ICD-10-CM | POA: Diagnosis present

## 2022-09-10 DIAGNOSIS — D352 Benign neoplasm of pituitary gland: Secondary | ICD-10-CM | POA: Diagnosis present

## 2022-09-10 HISTORY — PX: LAPAROTOMY: SHX154

## 2022-09-10 LAB — CBC WITH DIFFERENTIAL/PLATELET
Abs Immature Granulocytes: 0.15 10*3/uL — ABNORMAL HIGH (ref 0.00–0.07)
Basophils Absolute: 0 10*3/uL (ref 0.0–0.1)
Basophils Relative: 0 %
Eosinophils Absolute: 0 10*3/uL (ref 0.0–0.5)
Eosinophils Relative: 0 %
HCT: 40.9 % (ref 39.0–52.0)
Hemoglobin: 13.2 g/dL (ref 13.0–17.0)
Immature Granulocytes: 1 %
Lymphocytes Relative: 3 %
Lymphs Abs: 0.5 10*3/uL — ABNORMAL LOW (ref 0.7–4.0)
MCH: 28.9 pg (ref 26.0–34.0)
MCHC: 32.3 g/dL (ref 30.0–36.0)
MCV: 89.5 fL (ref 80.0–100.0)
Monocytes Absolute: 0.7 10*3/uL (ref 0.1–1.0)
Monocytes Relative: 4 %
Neutro Abs: 14.4 10*3/uL — ABNORMAL HIGH (ref 1.7–7.7)
Neutrophils Relative %: 92 %
Platelets: 181 10*3/uL (ref 150–400)
RBC: 4.57 MIL/uL (ref 4.22–5.81)
RDW: 13.9 % (ref 11.5–15.5)
WBC: 15.8 10*3/uL — ABNORMAL HIGH (ref 4.0–10.5)
nRBC: 0 % (ref 0.0–0.2)

## 2022-09-10 LAB — BASIC METABOLIC PANEL
Anion gap: 10 (ref 5–15)
BUN: 19 mg/dL (ref 6–20)
CO2: 21 mmol/L — ABNORMAL LOW (ref 22–32)
Calcium: 7.9 mg/dL — ABNORMAL LOW (ref 8.9–10.3)
Chloride: 103 mmol/L (ref 98–111)
Creatinine, Ser: 1.3 mg/dL — ABNORMAL HIGH (ref 0.61–1.24)
GFR, Estimated: >60 mL/min (ref >60)
Glucose, Bld: 109 mg/dL — ABNORMAL HIGH (ref 70–99)
Potassium: 4 mmol/L (ref 3.5–5.1)
Sodium: 134 mmol/L — ABNORMAL LOW (ref 135–145)

## 2022-09-10 LAB — CBC
HCT: 41.5 % (ref 39.0–52.0)
Hemoglobin: 13.7 g/dL (ref 13.0–17.0)
MCH: 29 pg (ref 26.0–34.0)
MCHC: 33 g/dL (ref 30.0–36.0)
MCV: 87.9 fL (ref 80.0–100.0)
Platelets: 215 10*3/uL (ref 150–400)
RBC: 4.72 MIL/uL (ref 4.22–5.81)
RDW: 13.6 % (ref 11.5–15.5)
WBC: 21.5 10*3/uL — ABNORMAL HIGH (ref 4.0–10.5)
nRBC: 0 % (ref 0.0–0.2)

## 2022-09-10 LAB — RENAL FUNCTION PANEL
Albumin: 3.5 g/dL (ref 3.5–5.0)
Anion gap: 13 (ref 5–15)
BUN: 19 mg/dL (ref 6–20)
CO2: 21 mmol/L — ABNORMAL LOW (ref 22–32)
Calcium: 7.3 mg/dL — ABNORMAL LOW (ref 8.9–10.3)
Chloride: 99 mmol/L (ref 98–111)
Creatinine, Ser: 1.33 mg/dL — ABNORMAL HIGH (ref 0.61–1.24)
GFR, Estimated: >60 mL/min (ref >60)
Glucose, Bld: 121 mg/dL — ABNORMAL HIGH (ref 70–99)
Phosphorus: 3.4 mg/dL (ref 2.5–4.6)
Potassium: 3.7 mmol/L (ref 3.5–5.1)
Sodium: 133 mmol/L — ABNORMAL LOW (ref 135–145)

## 2022-09-10 LAB — LACTIC ACID, PLASMA
Lactic Acid, Venous: 1 mmol/L (ref 0.5–1.9)
Lactic Acid, Venous: 1.4 mmol/L (ref 0.5–1.9)
Lactic Acid, Venous: 2 mmol/L (ref 0.5–1.9)

## 2022-09-10 LAB — MRSA NEXT GEN BY PCR, NASAL: MRSA by PCR Next Gen: NOT DETECTED

## 2022-09-10 LAB — BLOOD GAS, ARTERIAL
Acid-Base Excess: 1.2 mmol/L (ref 0.0–2.0)
Bicarbonate: 26 mmol/L (ref 20.0–28.0)
Drawn by: 20012
FIO2: 28 %
O2 Content: 2 L/min
O2 Saturation: 96.4 %
Patient temperature: 37.4
pCO2 arterial: 42 mmHg (ref 32–48)
pH, Arterial: 7.4 (ref 7.35–7.45)
pO2, Arterial: 67 mmHg — ABNORMAL LOW (ref 83–108)

## 2022-09-10 LAB — HIV ANTIBODY (ROUTINE TESTING W REFLEX): HIV Screen 4th Generation wRfx: NONREACTIVE

## 2022-09-10 SURGERY — LAPAROTOMY, EXPLORATORY
Anesthesia: General

## 2022-09-10 MED ORDER — ROCURONIUM BROMIDE 10 MG/ML (PF) SYRINGE
PREFILLED_SYRINGE | INTRAVENOUS | Status: DC | PRN
  Administered 2022-09-10: 40 mg via INTRAVENOUS
  Administered 2022-09-10: 30 mg via INTRAVENOUS

## 2022-09-10 MED ORDER — PHENYLEPHRINE HCL (PRESSORS) 10 MG/ML IV SOLN
INTRAVENOUS | Status: AC
  Filled 2022-09-10: qty 1

## 2022-09-10 MED ORDER — ONDANSETRON HCL 4 MG/2ML IJ SOLN
INTRAMUSCULAR | Status: DC | PRN
  Administered 2022-09-10: 4 mg via INTRAVENOUS

## 2022-09-10 MED ORDER — IOHEXOL 300 MG/ML  SOLN
100.0000 mL | Freq: Once | INTRAMUSCULAR | Status: AC | PRN
  Administered 2022-09-10: 100 mL via INTRAVENOUS

## 2022-09-10 MED ORDER — CHLORHEXIDINE GLUCONATE CLOTH 2 % EX PADS
6.0000 | MEDICATED_PAD | Freq: Once | CUTANEOUS | Status: DC
  Administered 2022-09-10: 6 via TOPICAL

## 2022-09-10 MED ORDER — KETOROLAC TROMETHAMINE 30 MG/ML IJ SOLN: 30.0000 mg | Freq: Once | INTRAMUSCULAR | Status: AC | PRN

## 2022-09-10 MED ORDER — LACTATED RINGERS IV SOLN: INTRAVENOUS | Status: DC | PRN

## 2022-09-10 MED ORDER — ONDANSETRON HCL 4 MG/2ML IJ SOLN: 4.0000 mg | Freq: Four times a day (QID) | INTRAMUSCULAR | Status: DC | PRN

## 2022-09-10 MED ORDER — KETAMINE HCL 10 MG/ML IJ SOLN
INTRAMUSCULAR | Status: DC | PRN
  Administered 2022-09-10: 20 mg via INTRAVENOUS
  Administered 2022-09-10: 30 mg via INTRAVENOUS

## 2022-09-10 MED ORDER — PHENYLEPHRINE HCL-NACL 20-0.9 MG/250ML-% IV SOLN
INTRAVENOUS | Status: DC | PRN
  Administered 2022-09-10: 30 ug/min via INTRAVENOUS

## 2022-09-10 MED ORDER — SUGAMMADEX SODIUM 200 MG/2ML IV SOLN
INTRAVENOUS | Status: DC | PRN
  Administered 2022-09-10: 300 mg via INTRAVENOUS

## 2022-09-10 MED ORDER — DEXAMETHASONE SODIUM PHOSPHATE 10 MG/ML IJ SOLN
INTRAMUSCULAR | Status: AC
  Filled 2022-09-10: qty 1

## 2022-09-10 MED ORDER — DIPHENHYDRAMINE HCL 50 MG/ML IJ SOLN: 12.5000 mg | Freq: Four times a day (QID) | INTRAMUSCULAR | Status: DC | PRN

## 2022-09-10 MED ORDER — PROPOFOL 10 MG/ML IV BOLUS
INTRAVENOUS | Status: DC | PRN
  Administered 2022-09-10: 150 mg via INTRAVENOUS

## 2022-09-10 MED ORDER — NALOXONE HCL 0.4 MG/ML IJ SOLN: 0.4000 mg | INTRAMUSCULAR | Status: DC | PRN

## 2022-09-10 MED ORDER — CHLORHEXIDINE GLUCONATE CLOTH 2 % EX PADS: 6.0000 | MEDICATED_PAD | Freq: Once | CUTANEOUS | Status: DC

## 2022-09-10 MED ORDER — FENTANYL CITRATE (PF) 250 MCG/5ML IJ SOLN
INTRAMUSCULAR | Status: AC
  Filled 2022-09-10: qty 5

## 2022-09-10 MED ORDER — CHLORHEXIDINE GLUCONATE CLOTH 2 % EX PADS
6.0000 | MEDICATED_PAD | Freq: Every day | CUTANEOUS | Status: DC
  Administered 2022-09-11 – 2022-09-15 (×5): 6 via TOPICAL

## 2022-09-10 MED ORDER — FENTANYL CITRATE (PF) 100 MCG/2ML IJ SOLN
INTRAMUSCULAR | Status: AC
  Filled 2022-09-10: qty 2

## 2022-09-10 MED ORDER — ORAL CARE MOUTH RINSE: 15.0000 mL | OROMUCOSAL | Status: DC | PRN

## 2022-09-10 MED ORDER — ONDANSETRON HCL 4 MG/2ML IJ SOLN
INTRAMUSCULAR | Status: AC
  Filled 2022-09-10: qty 2

## 2022-09-10 MED ORDER — HYDROMORPHONE HCL 1 MG/ML IJ SOLN
INTRAMUSCULAR | Status: AC
  Administered 2022-09-10: 0.5 mg via INTRAVENOUS
  Filled 2022-09-10: qty 1

## 2022-09-10 MED ORDER — KETAMINE HCL 50 MG/5ML IJ SOSY
PREFILLED_SYRINGE | INTRAMUSCULAR | Status: AC
  Filled 2022-09-10: qty 5

## 2022-09-10 MED ORDER — FENTANYL CITRATE (PF) 250 MCG/5ML IJ SOLN
INTRAMUSCULAR | Status: DC | PRN
  Administered 2022-09-10: 100 ug via INTRAVENOUS
  Administered 2022-09-10: 50 ug via INTRAVENOUS
  Administered 2022-09-10: 100 ug via INTRAVENOUS

## 2022-09-10 MED ORDER — SODIUM CHLORIDE 0.9 % IV BOLUS
500.0000 mL | Freq: Once | INTRAVENOUS | Status: AC
  Administered 2022-09-10: 500 mL via INTRAVENOUS

## 2022-09-10 MED ORDER — ONDANSETRON HCL 4 MG/2ML IJ SOLN: 4.0000 mg | Freq: Once | INTRAMUSCULAR | Status: DC | PRN

## 2022-09-10 MED ORDER — DIPHENHYDRAMINE HCL 12.5 MG/5ML PO ELIX: 12.5000 mg | ORAL_SOLUTION | Freq: Four times a day (QID) | ORAL | Status: DC | PRN

## 2022-09-10 MED ORDER — HYDROMORPHONE 1 MG/ML IV SOLN
INTRAVENOUS | Status: DC
  Administered 2022-09-10: 30 mg via INTRAVENOUS
  Administered 2022-09-11 (×2): 1.2 mg via INTRAVENOUS
  Administered 2022-09-11: 4.2 mg via INTRAVENOUS
  Administered 2022-09-11: 1.2 mg via INTRAVENOUS
  Administered 2022-09-12: 0.6 mg via INTRAVENOUS
  Administered 2022-09-12: 0.5 mg via INTRAVENOUS
  Administered 2022-09-12: 0.9 mg via INTRAVENOUS
  Administered 2022-09-13: 0.3 mg via INTRAVENOUS
  Administered 2022-09-13: 0.6 mg via INTRAVENOUS
  Administered 2022-09-13: 0.9 mg via INTRAVENOUS
  Administered 2022-09-14 (×2): 0.3 mg via INTRAVENOUS
  Filled 2022-09-10: qty 30

## 2022-09-10 MED ORDER — 0.9 % SODIUM CHLORIDE (POUR BTL) OPTIME
TOPICAL | Status: DC | PRN
  Administered 2022-09-10: 2000 mL

## 2022-09-10 MED ORDER — HYDROMORPHONE HCL 1 MG/ML IJ SOLN: 0.2500 mg | INTRAMUSCULAR | Status: DC | PRN

## 2022-09-10 MED ORDER — MIDAZOLAM HCL 2 MG/2ML IJ SOLN
INTRAMUSCULAR | Status: DC | PRN
  Administered 2022-09-10: 2 mg via INTRAVENOUS

## 2022-09-10 MED ORDER — KETOROLAC TROMETHAMINE 30 MG/ML IJ SOLN
INTRAMUSCULAR | Status: AC
  Administered 2022-09-10: 30 mg via INTRAVENOUS
  Filled 2022-09-10: qty 1

## 2022-09-10 MED ORDER — DEXAMETHASONE SODIUM PHOSPHATE 4 MG/ML IJ SOLN
INTRAMUSCULAR | Status: DC | PRN
  Administered 2022-09-10: 10 mg via INTRAVENOUS

## 2022-09-10 MED ORDER — LIDOCAINE 2% (20 MG/ML) 5 ML SYRINGE
INTRAMUSCULAR | Status: DC | PRN
  Administered 2022-09-10: 100 mg via INTRAVENOUS

## 2022-09-10 MED ORDER — MIDAZOLAM HCL 2 MG/2ML IJ SOLN
INTRAMUSCULAR | Status: AC
  Filled 2022-09-10: qty 2

## 2022-09-10 MED ORDER — SUCCINYLCHOLINE CHLORIDE 200 MG/10ML IV SOSY
PREFILLED_SYRINGE | INTRAVENOUS | Status: DC | PRN
  Administered 2022-09-10: 140 mg via INTRAVENOUS

## 2022-09-10 MED ORDER — ALBUMIN HUMAN 5 % IV SOLN: INTRAVENOUS | Status: DC | PRN

## 2022-09-10 MED ORDER — ALBUMIN HUMAN 5 % IV SOLN
INTRAVENOUS | Status: AC
  Filled 2022-09-10: qty 500

## 2022-09-10 MED ORDER — CEFAZOLIN IN SODIUM CHLORIDE 3-0.9 GM/100ML-% IV SOLN
3.0000 g | INTRAVENOUS | Status: AC
  Administered 2022-09-10: 3 g via INTRAVENOUS
  Filled 2022-09-10: qty 100

## 2022-09-10 MED ORDER — HYDROMORPHONE HCL 1 MG/ML IJ SOLN
INTRAMUSCULAR | Status: DC | PRN
  Administered 2022-09-10: 1 mg via INTRAVENOUS

## 2022-09-10 MED ORDER — SODIUM CHLORIDE 0.9% FLUSH: 9.0000 mL | INTRAVENOUS | Status: DC | PRN

## 2022-09-10 MED ORDER — HYDROMORPHONE HCL 2 MG/ML IJ SOLN
INTRAMUSCULAR | Status: AC
  Filled 2022-09-10: qty 1

## 2022-09-10 SURGICAL SUPPLY — 42 items
APPLICATOR COTTON TIP 6 STRL (MISCELLANEOUS) ×1 IMPLANT
APPLICATOR COTTON TIP 6IN STRL (MISCELLANEOUS) ×1 IMPLANT
BAG COUNTER SPONGE SURGICOUNT (BAG) IMPLANT
BLADE EXTENDED COATED 6.5IN (ELECTRODE) IMPLANT
BLADE HEX COATED 2.75 (ELECTRODE) ×1 IMPLANT
BNDG GAUZE DERMACEA FLUFF 4 (GAUZE/BANDAGES/DRESSINGS) IMPLANT
COVER MAYO STAND STRL (DRAPES) IMPLANT
DRAIN CHANNEL 19F RND (DRAIN) IMPLANT
DRAPE LAPAROSCOPIC ABDOMINAL (DRAPES) ×1 IMPLANT
DRAPE WARM FLUID 44X44 (DRAPES) IMPLANT
ELECT REM PT RETURN 15FT ADLT (MISCELLANEOUS) ×1 IMPLANT
EVACUATOR SILICONE 100CC (DRAIN) IMPLANT
GAUZE PAD ABD 8X10 STRL (GAUZE/BANDAGES/DRESSINGS) IMPLANT
GAUZE SPONGE 4X4 12PLY STRL (GAUZE/BANDAGES/DRESSINGS) ×1 IMPLANT
GLOVE BIOGEL PI IND STRL 7.0 (GLOVE) ×1 IMPLANT
GLOVE ECLIPSE 8.0 STRL XLNG CF (GLOVE) ×1 IMPLANT
GLOVE INDICATOR 8.0 STRL GRN (GLOVE) ×2 IMPLANT
GOWN STRL REUS W/ TWL XL LVL3 (GOWN DISPOSABLE) ×2 IMPLANT
GOWN STRL REUS W/TWL XL LVL3 (GOWN DISPOSABLE) ×2
HANDLE SUCTION POOLE (INSTRUMENTS) IMPLANT
KIT BASIN OR (CUSTOM PROCEDURE TRAY) ×1 IMPLANT
KIT TURNOVER KIT A (KITS) IMPLANT
LIGASURE IMPACT 36 18CM CVD LR (INSTRUMENTS) IMPLANT
NS IRRIG 1000ML POUR BTL (IV SOLUTION) ×1 IMPLANT
PACK GENERAL/GYN (CUSTOM PROCEDURE TRAY) ×1 IMPLANT
RELOAD PROXIMATE 75MM BLUE (ENDOMECHANICALS) ×1 IMPLANT
RELOAD STAPLE 75 3.8 BLU REG (ENDOMECHANICALS) IMPLANT
STAPLER PROXIMATE 75MM BLUE (STAPLE) IMPLANT
STAPLER VISISTAT 35W (STAPLE) ×1 IMPLANT
SUCTION POOLE HANDLE (INSTRUMENTS)
SUT PROLENE 2 0 SH DA (SUTURE) IMPLANT
SUT SILK 2 0 (SUTURE)
SUT SILK 2-0 18XBRD TIE 12 (SUTURE) IMPLANT
SUT SILK 3 0 (SUTURE)
SUT SILK 3 0 SH CR/8 (SUTURE) IMPLANT
SUT SILK 3-0 18XBRD TIE 12 (SUTURE) IMPLANT
SUT VIC AB 2-0 SH 18 (SUTURE) IMPLANT
SUT VIC AB 3-0 SH 18 (SUTURE) IMPLANT
TAPE CLOTH SURG 6X10 WHT LF (GAUZE/BANDAGES/DRESSINGS) IMPLANT
TOWEL OR 17X26 10 PK STRL BLUE (TOWEL DISPOSABLE) ×2 IMPLANT
TRAY FOLEY MTR SLVR 16FR STAT (SET/KITS/TRAYS/PACK) IMPLANT
YANKAUER SUCT BULB TIP NO VENT (SUCTIONS) IMPLANT

## 2022-09-10 NOTE — Anesthesia Preprocedure Evaluation (Signed)
Anesthesia Evaluation  Patient identified by MRN, date of birth, ID band Patient awake    Reviewed: Allergy & Precautions, H&P , NPO status , Patient's Chart, lab work & pertinent test results  Airway Mallampati: II  TM Distance: >3 FB Neck ROM: Full    Dental no notable dental hx.    Pulmonary neg pulmonary ROS   Pulmonary exam normal breath sounds clear to auscultation       Cardiovascular negative cardio ROS Normal cardiovascular exam Rhythm:Regular Rate:Normal     Neuro/Psych negative neurological ROS  negative psych ROS   GI/Hepatic Neg liver ROS,,,Ruptured colon   Endo/Other  negative endocrine ROS    Renal/GU negative Renal ROS  negative genitourinary   Musculoskeletal negative musculoskeletal ROS (+)    Abdominal   Peds negative pediatric ROS (+)  Hematology negative hematology ROS (+)   Anesthesia Other Findings   Reproductive/Obstetrics negative OB ROS                             Anesthesia Physical Anesthesia Plan  ASA: 2 and emergent  Anesthesia Plan: General   Post-op Pain Management:    Induction: Intravenous and Rapid sequence  PONV Risk Score and Plan: 2 and Ondansetron, Dexamethasone and Treatment may vary due to age or medical condition  Airway Management Planned: Oral ETT  Additional Equipment:   Intra-op Plan:   Post-operative Plan: Extubation in OR  Informed Consent: I have reviewed the patients History and Physical, chart, labs and discussed the procedure including the risks, benefits and alternatives for the proposed anesthesia with the patient or authorized representative who has indicated his/her understanding and acceptance.     Dental advisory given  Plan Discussed with: CRNA and Surgeon  Anesthesia Plan Comments:        Anesthesia Quick Evaluation

## 2022-09-10 NOTE — Progress Notes (Signed)
Patient had repeat CT scan with increasing fluid and gas.  Patient's heart rate is increased as well as his requirement for oxygen.  He has been moved out of the ICU.  He is hemodynamically stable but clinically is worsening and having more pain.  I discussed the findings of the repeat CT scan with him.  I do feel his rectal perforation is progressing and he is becoming more ill from this.  I discussed options of surgery recommend exploratory laparotomy with colostomy to divert him as well as potential resect the injured area or potentially close drain it.  The risks and benefits of surgery reviewed and the need to do this emphasized the patient due to his worsening physiological state.  I discussed the need for colostomy the circumstance and that it can be reversed in the future but short-term this is necessary to help treat his acute problem.  Risk and benefits surgery reviewed including bleeding, infection, injury to nerve structures, injury to the ureter, injury to bladder, injury to small bowel, fistula formation, abscess formation, DVT, death, cardiovascular event and worsening underlying medical condition.  He voiced understanding agrees to proceed

## 2022-09-10 NOTE — ED Notes (Signed)
ED TO INPATIENT HANDOFF REPORT  ED Nurse Name and Phone #: Marcia Lepera  S Name/Age/Gender George Cooley 42 y.o. male Room/Bed: WA09/WA09  Code Status   Code Status: Full Code  Home/SNF/Other Home Patient oriented to: self, place, time, and situation Is this baseline? Yes   Triage Complete: Triage complete  Chief Complaint Perforated diverticulum of large intestine [K57.20]  Triage Note C/o sharp lower abd pain x4 hrs after using an enema. Tender on palpitation. Emesis x1  Denies BM or urination since pain started No abd distention noted.    Allergies No Known Allergies  Level of Care/Admitting Diagnosis ED Disposition     ED Disposition  Admit   Condition  --   Comment  Hospital Area: [facility] [[id number]]  Level of Care: Med-Surg [16]  May place patient in observation at [facility] or [facility] if equivalent level of care is available:: Yes  Covid Evaluation: Asymptomatic - no recent exposure (last 10 days) testing not required  Diagnosis: Perforated diverticulum of large intestine [161096]  Admitting Physician: Orland Mustard [[id number]]  Attending Physician: Orland Mustard [[id number]]          B Medical/Surgery History History reviewed. No pertinent past medical history. History reviewed. No pertinent surgical history.   A IV Location/Drains/Wounds Patient Lines/Drains/Airways Status     Active Line/Drains/Airways     Name Placement date Placement time Site Days   Peripheral IV 09/09/22 20 G Left;Posterior Hand 09/09/22  1813  Hand  1   Peripheral IV 09/09/22 20 G 1" Anterior;Right Forearm 09/09/22  2310  Forearm  1            Intake/Output Last 24 hours No intake or output data in the 24 hours ending 09/10/22 2956  Labs/Imaging Results for orders placed or performed during the hospital encounter of 09/09/22 (from the past 48 hour(s))  Urinalysis, Routine w reflex microscopic -Urine, Clean Catch     Status: Abnormal    Collection Time: 09/09/22  5:28 PM  Result Value Ref Range   Color, Urine YELLOW YELLOW   APPearance CLEAR CLEAR   Specific Gravity, Urine 1.015 1.005 - 1.030   pH 6.0 5.0 - 8.0   Glucose, UA NEGATIVE NEGATIVE mg/dL   Hgb urine dipstick NEGATIVE NEGATIVE   Bilirubin Urine NEGATIVE NEGATIVE   Ketones, ur 5 (A) NEGATIVE mg/dL   Protein, ur 30 (A) NEGATIVE mg/dL   Nitrite NEGATIVE NEGATIVE   Leukocytes,Ua NEGATIVE NEGATIVE   RBC / HPF 0-5 0 - 5 RBC/hpf   WBC, UA 0-5 0 - 5 WBC/hpf   Bacteria, UA NONE SEEN NONE SEEN   Squamous Epithelial / HPF 0-5 0 - 5 /HPF   Mucus PRESENT    Hyaline Casts, UA PRESENT     Comment: Performed at [facility], [address], [place], [place] [address]  CBC with Differential     Status: Abnormal   Collection Time: 09/09/22  6:10 PM  Result Value Ref Range   WBC 15.8 (H) 4.0 - 10.5 K/uL   RBC 5.43 4.22 - 5.81 MIL/uL   Hemoglobin 15.7 13.0 - 17.0 g/dL   HCT 65.7 84.6 - 96.2 %   MCV 86.0 80.0 - 100.0 fL   MCH 28.9 26.0 - 34.0 pg   MCHC 33.6 30.0 - 36.0 g/dL   RDW 95.2 84.1 - 32.4 %   Platelets 231 150 - 400 K/uL   nRBC 0.0 0.0 - 0.2 %   Neutrophils Relative % 92 %  Neutro Abs 14.4 (H) 1.7 - 7.7 K/uL   Lymphocytes Relative 4 %   Lymphs Abs 0.6 (L) 0.7 - 4.0 K/uL   Monocytes Relative 4 %   Monocytes Absolute 0.7 0.1 - 1.0 K/uL   Eosinophils Relative 0 %   Eosinophils Absolute 0.0 0.0 - 0.5 K/uL   Basophils Relative 0 %   Basophils Absolute 0.0 0.0 - 0.1 K/uL   Immature Granulocytes 0 %   Abs Immature Granulocytes 0.06 0.00 - 0.07 K/uL    Comment: Performed at [facility], [address], [place], [place] [address]  Comprehensive metabolic panel     Status: Abnormal   Collection Time: 09/09/22  6:10 PM  Result Value Ref Range   Sodium 134 (L) 135 - 145 mmol/L   Potassium 3.7 3.5 - 5.1 mmol/L   Chloride 101 98 - 111 mmol/L   CO2 21 (L) 22 - 32 mmol/L   Glucose, Bld 130 (H) 70 - 99 mg/dL    Comment:  Glucose reference range applies only to samples taken after fasting for at least 8 hours.   BUN 15 6 - 20 mg/dL   Creatinine, Ser 6.04 0.61 - 1.24 mg/dL   Calcium 8.6 (L) 8.9 - 10.3 mg/dL   Total Protein 7.6 6.5 - 8.1 g/dL   Albumin 4.4 3.5 - 5.0 g/dL   AST 31 15 - 41 U/L   ALT 30 0 - 44 U/L   Alkaline Phosphatase 60 38 - 126 U/L   Total Bilirubin 1.8 (H) 0.3 - 1.2 mg/dL   GFR, Estimated >54 >09 mL/min    Comment: (NOTE) Calculated using the CKD-EPI Creatinine Equation (2021)    Anion gap 12 5 - 15    Comment: Performed at [facility], [address] [place], [place] [address]  Lipase, blood     Status: None   Collection Time: 09/09/22  6:10 PM  Result Value Ref Range   Lipase 36 11 - 51 U/L    Comment: Performed at [facility], [address], [place], [place] [address]  Lactic acid, plasma     Status: Abnormal   Collection Time: 09/09/22 11:09 PM  Result Value Ref Range   Lactic Acid, Venous 2.0 (HH) 0.5 - 1.9 mmol/L    Comment: CRITICAL RESULT CALLED TO, READ BACK BY AND VERIFIED WITH LIVINGSTON,K AT 0021 ON 09/10/22 BY LUZOLOP Performed at [facility], [address], [place], [place] [address]   Magnesium     Status: None   Collection Time: 09/09/22 11:09 PM  Result Value Ref Range   Magnesium 1.7 1.7 - 2.4 mg/dL    Comment: Performed at [facility], [address], [place], [place] [address]  Lactic acid, plasma     Status: None   Collection Time: 09/10/22 12:30 AM  Result Value Ref Range   Lactic Acid, Venous 1.4 0.5 - 1.9 mmol/L    Comment: Performed at [facility], [address], [place], [place] [address]  Basic metabolic panel     Status: Abnormal   Collection Time: 09/10/22  5:00 AM  Result Value Ref Range   Sodium 134 (L) 135 - 145 mmol/L   Potassium 4.0 3.5 - 5.1 mmol/L   Chloride 103 98 - 111 mmol/L   CO2 21 (L) 22 - 32 mmol/L   Glucose, Bld 109 (H) 70 -  99 mg/dL    Comment: Glucose reference range applies only to samples  taken after fasting for at least 8 hours.   BUN 19 6 - 20 mg/dL   Creatinine, Ser 2.44 (H) 0.61 - 1.24 mg/dL   Calcium 7.9 (L) 8.9 - 10.3 mg/dL   GFR, Estimated >01 >02 mL/min    Comment: (NOTE) Calculated using the CKD-EPI Creatinine Equation (2021)    Anion gap 10 5 - 15    Comment: Performed at [facility], [address] [place], [place] [address]  CBC     Status: Abnormal   Collection Time: 09/10/22  5:00 AM  Result Value Ref Range   WBC 21.5 (H) 4.0 - 10.5 K/uL   RBC 4.72 4.22 - 5.81 MIL/uL   Hemoglobin 13.7 13.0 - 17.0 g/dL   HCT 64.4 03.4 - 74.2 %   MCV 87.9 80.0 - 100.0 fL   MCH 29.0 26.0 - 34.0 pg   MCHC 33.0 30.0 - 36.0 g/dL   RDW 59.5 63.8 - 75.6 %   Platelets 215 150 - 400 K/uL   nRBC 0.0 0.0 - 0.2 %    Comment: Performed at [facility], [address], [place], [place] [address]   CT ABDOMEN PELVIS W CONTRAST  Result Date: 09/09/2022 CLINICAL DATA:  Lower abdominal pain EXAM: CT ABDOMEN AND PELVIS WITH CONTRAST TECHNIQUE: Multidetector CT imaging of the abdomen and pelvis was performed using the standard protocol following bolus administration of intravenous contrast. RADIATION DOSE REDUCTION: This exam was performed according to the departmental dose-optimization program which includes automated exposure control, adjustment of the mA and/or kV according to patient size and/or use of iterative reconstruction technique. CONTRAST:  OMNIPAQUE IOHEXOL 300 MG/ML  SOLN COMPARISON:  None Available. FINDINGS: Lower chest: There is crowding of markings in the posterior lower lung fields, possibly subsegmental atelectasis. In image 23 of series 5, there is 10 x 7 mm pleural-based nodule in the lateral left lower lung field in left lower lobe. Hepatobiliary: No focal abnormalities are seen in liver. Gallbladder is unremarkable. Pancreas: No focal abnormalities are seen.  Spleen: Unremarkable. Adrenals/Urinary Tract: Adrenals are unremarkable. There is no hydronephrosis. There are no renal or ureteral stones. There is mild diffuse wall thickening in the urinary bladder. Stomach/Bowel: There is fluid in the lumen of lower thoracic esophagus suggesting gastroesophageal reflux. Stomach is moderately distended. Small bowel loops are not dilated. The appendix is not dilated. There is fluid in the lumen of ascending colon. Scattered diverticula are seen in colon. There is mild diffuse wall thickening in rectosigmoid. There are few small pockets of air adjacent to the sigmoid colon in midline, possibly contained bowel perforation and extraluminal air due to trauma or acute diverticulitis or acute colitis. There is 4.9 x 3 cm serous fluid collection in the posterior pelvic cavity adjacent to the rectosigmoid. Vascular/Lymphatic: No acute findings are seen in aorta and its major branches. There are vascular coils in the course of left gonadal vein. Small scattered arterial calcifications are seen. Reproductive: Prostate is slightly enlarged. Other: There is no pneumoperitoneum in the upper abdomen. Small pockets of air adjacent to the rectosigmoid may suggest contained perforation. Bilateral inguinal hernias containing fat are seen, larger on the left side. Musculoskeletal: No acute findings are seen. IMPRESSION: There are few small pockets of air lying immediately adjacent to the rectosigmoid suggesting possible contained perforation. This may be due to trauma or related to colitis or diverticulitis. There is diffuse wall thickening in rectosigmoid. Scattered diverticula are seen in colon. There is small amount of  serous fluid in pelvic cavity. There is no demonstrable large intraperitoneal or retroperitoneal hematoma. Surgical consultation should be considered. There is no hydronephrosis. There is no evidence of intestinal obstruction or pneumoperitoneum in the upper abdomen. There is fluid  in the lumen of lower thoracic esophagus suggesting gastroesophageal reflux. There is there is mild diffuse wall thickening in the urinary bladder which may be due to incomplete distention or suggest cystitis. There is 10 mm pleural-based nodule in left lower lobe. Follow-up CT chest in 3 months may be considered. There are linear densities in the posterior lower lung fields suggesting possible subsegmental atelectasis. Imaging finding of possible contained perforation in rectosigmoid was relayed to patient's provider [facility] by telephone call. Electronically Signed   By: Ernie Avena M.D.   On: 09/09/2022 20:24   DG Abd Portable 1V  Result Date: 09/09/2022 CLINICAL DATA:  Pain, evaluate for free air. EXAM: PORTABLE ABDOMEN - 1 VIEW COMPARISON:  None Available. FINDINGS: The bowel gas pattern is normal. No free air identified. No radio-opaque calculi calculi. Embolic coils are seen in the left abdomen. IMPRESSION: Nonobstructive bowel gas pattern. Electronically Signed   By: Darliss Cheney M.D.   On: 09/09/2022 19:23    Pending Labs Unresulted Labs (From admission, onward)     Start     Ordered   09/09/22 2203  HIV Antibody (routine testing w rflx)  (HIV Antibody (Routine testing w reflex) panel)  Once,   R        09/09/22 2206   09/09/22 2122  Culture, blood (Routine X 2) w Reflex to ID Panel  BLOOD CULTURE X 2,   R      09/09/22 2121            Vitals/Pain Today's Vitals   09/10/22 0307 09/10/22 0319 09/10/22 0345 09/10/22 0630  BP:   (!) 110/59 102/61  Pulse:   (!) 111 (!) 107  Resp:   17 16  Temp: (!) 97.4 F (36.3 C)     TempSrc: Oral     SpO2:   93% 96%  PainSc:  9       Isolation Precautions No active isolations  Medications Medications  pantoprazole (PROTONIX) injection 40 mg (40 mg Intravenous Given 09/09/22 2313)  piperacillin-tazobactam (ZOSYN) IVPB 3.375 g (3.375 g Intravenous New Bag/Given 09/10/22 0323)  lactated ringers infusion ( Intravenous New Bag/Given  09/09/22 2307)  HYDROmorphone (DILAUDID) injection 0.5-1 mg (1 mg Intravenous Given 09/10/22 0319)  ondansetron (ZOFRAN) tablet 4 mg ( Oral See Alternative 09/10/22 0058)    Or  ondansetron (ZOFRAN) injection 4 mg (4 mg Intravenous Given 09/10/22 0058)  lamoTRIgine (LAMICTAL) tablet 100 mg (100 mg Oral Given 09/10/22 0053)  sertraline (ZOLOFT) tablet 200 mg (200 mg Oral Given 09/10/22 0053)  levothyroxine (SYNTHROID) tablet 88 mcg (88 mcg Oral Given 09/10/22 0610)  atorvastatin (LIPITOR) tablet 10 mg (has no administration in time range)  buPROPion (WELLBUTRIN XL) 24 hr tablet 150 mg (has no administration in time range)  HYDROmorphone (DILAUDID) injection 1 mg (1 mg Intravenous Given 09/09/22 1830)  ondansetron (ZOFRAN) injection 4 mg (4 mg Intravenous Given 09/09/22 1830)  HYDROmorphone (DILAUDID) injection 1 mg (1 mg Intravenous Given 09/09/22 1859)  iohexol (OMNIPAQUE) 300 MG/ML solution 100 mL (100 mLs Intravenous Contrast Given 09/09/22 1935)  piperacillin-tazobactam (ZOSYN) IVPB 3.375 g (0 g Intravenous Stopped 09/09/22 2318)  lactated ringers bolus 1,000 mL (0 mLs Intravenous Stopped 09/09/22 2318)  HYDROmorphone (DILAUDID) injection 1 mg (1 mg Intravenous Given 09/09/22 2103)  sodium chloride 0.9 % bolus 500 mL (0 mLs Intravenous Stopped 09/10/22 0307)    Mobility walks     Focused Assessments Ab pain   R Recommendations: See Admitting Provider Note  Report given to:   Additional Notes: .

## 2022-09-10 NOTE — Progress Notes (Signed)
PROGRESS NOTE    George Cooley  WNU:[id number] DOB: [birth year] DOA: 09/09/2022 PCP: Nonnie Done., MD  Outpatient Specialists:     Brief Narrative:  Patient is a 42 year old Caucasian male with past medical history significant for ADHD, hypothyroidism, hyperlipidemia, depression, hypotestosteronism and pituitary microadenoma.  According to the patient, he has used water enema for over 15 to 20 years.  Patient experienced sharp and excruciating pain while performing rectal enema.  Patient is currently on IV Zosyn.  Repeat CT scan of abdomen and pelvis with contrast revealed " Perforated sigmoid diverticulitis or colitis is again noted, with increased free fluid and free gas since prior exam. Progressive colonic wall thickening. No drainable fluid collection or abscess at this time.  2. Worsening bibasilar hypoventilatory changes".  09/10/2022: Patient seen alongside patient's nurse and wife.  Above documentation noted.  Surgical input is appreciated.  Patient is on IV Zosyn.  Patient is tachycardic, with some low O2 sat.  I would not be surprised if patient becomes very sick.   Assessment & Plan:   Principal Problem:   sespsis secondary to perorated rectosigmoid colon Active Problems:   Attention deficit hyperactivity disorder (ADHD)   Depressive disorder   Pituitary microadenoma (HCC)   Pleural nodule   Sepsis (HCC)   GERD (gastroesophageal reflux disease)   SIRS/Sespsis secondary to perorated rectosigmoid colon: 42  year old male presenting with acute onset of abdominal pain shortly after placing an enema found to have possible contained perforation in the rectosigmoid colon due to trauma from enema or diverticulitis/colitis meeting sepsis criteria with leukocytosis and tachycardia.  -Patient is currently on IV Zosyn. -Patient could become very sick. -Transfer patient to stepdown unit. -Surgical input is appreciated.  The surgery team is aware of the results of repeat CT  abdomen and pelvis. -Continue to keep patient NPO.    Worsening leukocytosis: -Likely secondary to above. -Continue to monitor.   Pleural nodule Incidental finding on CT: 10mm pleural-based nodule in LLL.  Recommend f/u CT chest in 3 months    Depressive disorder He is on zoloft, wellbutrin and lamotrigine   Pituitary microadenoma (HCC) On cabergoline .25mg  twice/week    GERD (gastroesophageal reflux disease) On famotidine BID, will give him PPI IV    Attention deficit hyperactivity disorder (ADHD) On Adzneyx QAM Will hold while inpatient        DVT prophylaxis: SCD. Code Status: Full code. Family Communication: Wife. Disposition Plan: This will depend on hospital course.   Consultants:  Surgery.  Procedures:  None.  Antimicrobials:  IV Zosyn.   Subjective: Rectal pain.  Objective: Vitals:   09/10/22 0630 09/10/22 0745 09/10/22 0828 09/10/22 0842  BP: 102/61 102/67  103/71  Pulse: (!) 107 (!) 111 (!) 110 (!) 110  Resp: 16 18  18   Temp:   98.4 F (36.9 C) 98.9 F (37.2 C)  TempSrc:   Oral Oral  SpO2: 96% 96%  95%    Intake/Output Summary (Last 24 hours) at 09/10/2022 0347 Last data filed at 09/10/2022 0908 Gross per 24 hour  Intake --  Output 500 ml  Net -500 ml   There were no vitals filed for this visit.  Examination:  General exam: Ill looking. Respiratory system: Clear to auscultation.  Cardiovascular system: S1 & S2 heard Gastrointestinal system: Abdomen is obese, tender.  Organs are difficult to assess.   Central nervous system: Alert and oriented.  Patient moves all extremities. Extremities: No leg edema.  Data Reviewed: I have personally reviewed  following labs and imaging studies  CBC: Recent Labs  Lab 09/09/22 1810 09/10/22 0500  WBC 15.8* 21.5*  NEUTROABS 14.4*  --   HGB 15.7 13.7  HCT 46.7 41.5  MCV 86.0 87.9  PLT 231 215   Basic Metabolic Panel: Recent Labs  Lab 09/09/22 1810 09/09/22 2309 09/10/22 0500  NA  134*  --  134*  K 3.7  --  4.0  CL 101  --  103  CO2 21*  --  21*  GLUCOSE 130*  --  109*  BUN 15  --  19  CREATININE 1.20  --  1.30*  CALCIUM 8.6*  --  7.9*  MG  --  1.7  --    GFR: CrCl cannot be calculated (Unknown ideal weight.). Liver Function Tests: Recent Labs  Lab 09/09/22 1810  AST 31  ALT 30  ALKPHOS 60  BILITOT 1.8*  PROT 7.6  ALBUMIN 4.4   Recent Labs  Lab 09/09/22 1810  LIPASE 36   No results for input(s): "AMMONIA" in the last 168 hours. Coagulation Profile: No results for input(s): "INR", "PROTIME" in the last 168 hours. Cardiac Enzymes: No results for input(s): "CKTOTAL", "CKMB", "CKMBINDEX", "TROPONINI" in the last 168 hours. BNP (last 3 results) No results for input(s): "PROBNP" in the last 8760 hours. HbA1C: No results for input(s): "HGBA1C" in the last 72 hours. CBG: No results for input(s): "GLUCAP" in the last 168 hours. Lipid Profile: No results for input(s): "CHOL", "HDL", "LDLCALC", "TRIG", "CHOLHDL", "LDLDIRECT" in the last 72 hours. Thyroid Function Tests: No results for input(s): "TSH", "T4TOTAL", "FREET4", "T3FREE", "THYROIDAB" in the last 72 hours. Anemia Panel: No results for input(s): "VITAMINB12", "FOLATE", "FERRITIN", "TIBC", "IRON", "RETICCTPCT" in the last 72 hours. Urine analysis:    Component Value Date/Time   COLORURINE YELLOW 09/09/2022 1728   APPEARANCEUR CLEAR 09/09/2022 1728   LABSPEC 1.015 09/09/2022 1728   PHURINE 6.0 09/09/2022 1728   GLUCOSEU NEGATIVE 09/09/2022 1728   HGBUR NEGATIVE 09/09/2022 1728   BILIRUBINUR NEGATIVE 09/09/2022 1728   KETONESUR 5 (A) 09/09/2022 1728   PROTEINUR 30 (A) 09/09/2022 1728   NITRITE NEGATIVE 09/09/2022 1728   LEUKOCYTESUR NEGATIVE 09/09/2022 1728   Sepsis Labs: @LABRCNTIP (procalcitonin:4,lacticidven:4)  )No results found for this or any previous visit (from the past 240 hour(s)).       Radiology Studies: CT ABDOMEN PELVIS W CONTRAST  Result Date: 09/09/2022 CLINICAL  DATA:  Lower abdominal pain EXAM: CT ABDOMEN AND PELVIS WITH CONTRAST TECHNIQUE: Multidetector CT imaging of the abdomen and pelvis was performed using the standard protocol following bolus administration of intravenous contrast. RADIATION DOSE REDUCTION: This exam was performed according to the departmental dose-optimization program which includes automated exposure control, adjustment of the mA and/or kV according to patient size and/or use of iterative reconstruction technique. CONTRAST:  OMNIPAQUE IOHEXOL 300 MG/ML  SOLN COMPARISON:  None Available. FINDINGS: Lower chest: There is crowding of markings in the posterior lower lung fields, possibly subsegmental atelectasis. In image 23 of series 5, there is 10 x 7 mm pleural-based nodule in the lateral left lower lung field in left lower lobe. Hepatobiliary: No focal abnormalities are seen in liver. Gallbladder is unremarkable. Pancreas: No focal abnormalities are seen. Spleen: Unremarkable. Adrenals/Urinary Tract: Adrenals are unremarkable. There is no hydronephrosis. There are no renal or ureteral stones. There is mild diffuse wall thickening in the urinary bladder. Stomach/Bowel: There is fluid in the lumen of lower thoracic esophagus suggesting gastroesophageal reflux. Stomach is moderately distended. Small bowel loops  are not dilated. The appendix is not dilated. There is fluid in the lumen of ascending colon. Scattered diverticula are seen in colon. There is mild diffuse wall thickening in rectosigmoid. There are few small pockets of air adjacent to the sigmoid colon in midline, possibly contained bowel perforation and extraluminal air due to trauma or acute diverticulitis or acute colitis. There is 4.9 x 3 cm serous fluid collection in the posterior pelvic cavity adjacent to the rectosigmoid. Vascular/Lymphatic: No acute findings are seen in aorta and its major branches. There are vascular coils in the course of left gonadal vein. Small scattered  arterial calcifications are seen. Reproductive: Prostate is slightly enlarged. Other: There is no pneumoperitoneum in the upper abdomen. Small pockets of air adjacent to the rectosigmoid may suggest contained perforation. Bilateral inguinal hernias containing fat are seen, larger on the left side. Musculoskeletal: No acute findings are seen. IMPRESSION: There are few small pockets of air lying immediately adjacent to the rectosigmoid suggesting possible contained perforation. This may be due to trauma or related to colitis or diverticulitis. There is diffuse wall thickening in rectosigmoid. Scattered diverticula are seen in colon. There is small amount of serous fluid in pelvic cavity. There is no demonstrable large intraperitoneal or retroperitoneal hematoma. Surgical consultation should be considered. There is no hydronephrosis. There is no evidence of intestinal obstruction or pneumoperitoneum in the upper abdomen. There is fluid in the lumen of lower thoracic esophagus suggesting gastroesophageal reflux. There is there is mild diffuse wall thickening in the urinary bladder which may be due to incomplete distention or suggest cystitis. There is 10 mm pleural-based nodule in left lower lobe. Follow-up CT chest in 3 months may be considered. There are linear densities in the posterior lower lung fields suggesting possible subsegmental atelectasis. Imaging finding of possible contained perforation in rectosigmoid was relayed to patient's provider [facility] by telephone call. Electronically Signed   By: Ernie Avena M.D.   On: 09/09/2022 20:24   DG Abd Portable 1V  Result Date: 09/09/2022 CLINICAL DATA:  Pain, evaluate for free air. EXAM: PORTABLE ABDOMEN - 1 VIEW COMPARISON:  None Available. FINDINGS: The bowel gas pattern is normal. No free air identified. No radio-opaque calculi calculi. Embolic coils are seen in the left abdomen. IMPRESSION: Nonobstructive bowel gas pattern. Electronically Signed   By: Darliss Cheney M.D.   On: 09/09/2022 19:23        Scheduled Meds:  atorvastatin  10 mg Oral QPM   buPROPion  150 mg Oral Daily   lamoTRIgine  100 mg Oral QHS   levothyroxine  88 mcg Oral Q0600   pantoprazole (PROTONIX) IV  40 mg Intravenous Q24H   sertraline  200 mg Oral QHS   Continuous Infusions:  lactated ringers 100 mL/hr at 09/10/22 0927   piperacillin-tazobactam (ZOSYN)  IV 3.375 g (09/10/22 0323)     LOS: 0 days    Time spent: 55 minutes.    Berton Mount, MD  [facility] Pager #: [phone] 7PM-7AM contact night coverage as above

## 2022-09-10 NOTE — Progress Notes (Signed)
Zoe, RN w/RR,in to see pt. Dr Dartha Lodge just left and was aware BP Soft and Tachy w/low grade temp. Spoke with Dr Luisa Hart prior to 1600 about critical findings on CT, he said radiology had called and made him aware.

## 2022-09-10 NOTE — Transfer of Care (Signed)
Immediate Anesthesia Transfer of Care Note  Patient: George Cooley  Procedure(s) Performed: EXPLORATORY LAPAROTOMY WITH COLOSTOMY  Patient Location: PACU  Anesthesia Type:General  Level of Consciousness: awake and patient cooperative  Airway & Oxygen Therapy: Patient Spontanous Breathing and Patient connected to face mask  Post-op Assessment: Report given to RN and Post -op Vital signs reviewed and stable  Post vital signs: Reviewed and stable  Last Vitals:  Vitals Value Taken Time  BP 112/76 09/10/22 1945  Temp    Pulse 112 09/10/22 1947  Resp 12 09/10/22 1947  SpO2 90 % 09/10/22 1947  Vitals shown include unvalidated device data.  Last Pain:  Vitals:   09/10/22 1240  TempSrc:   PainSc: 4       Patients Stated Pain Goal: 4 (09/10/22 1210)  Complications: No notable events documented.

## 2022-09-10 NOTE — Progress Notes (Signed)
Subjective/Chief Complaint: States he feels better overall, no flatus/stool   Objective: Vital signs in last 24 hours: Temp:  [97.4 F (36.3 C)-98.3 F (36.8 C)] 97.4 F (36.3 C) (04/27 0307) Pulse Rate:  [93-111] 111 (04/27 0745) Resp:  [16-22] 18 (04/27 0745) BP: (92-113)/(59-75) 102/67 (04/27 0745) SpO2:  [91 %-96 %] 96 % (04/27 0745)    Intake/Output from previous day: No intake/output data recorded. Intake/Output this shift: No intake/output data recorded.  Ab tender bilateral lower quadrants, nondistended  Lab Results:  Recent Labs    09/09/22 1810 09/10/22 0500  WBC 15.8* 21.5*  HGB 15.7 13.7  HCT 46.7 41.5  PLT 231 215   BMET Recent Labs    09/09/22 1810 09/10/22 0500  NA 134* 134*  K 3.7 4.0  CL 101 103  CO2 21* 21*  GLUCOSE 130* 109*  BUN 15 19  CREATININE 1.20 1.30*  CALCIUM 8.6* 7.9*   PT/INR No results for input(s): "LABPROT", "INR" in the last 72 hours. ABG No results for input(s): "PHART", "HCO3" in the last 72 hours.  Invalid input(s): "PCO2", "PO2"  Studies/Results: CT ABDOMEN PELVIS W CONTRAST  Result Date: 09/09/2022 CLINICAL DATA:  Lower abdominal pain EXAM: CT ABDOMEN AND PELVIS WITH CONTRAST TECHNIQUE: Multidetector CT imaging of the abdomen and pelvis was performed using the standard protocol following bolus administration of intravenous contrast. RADIATION DOSE REDUCTION: This exam was performed according to the departmental dose-optimization program which includes automated exposure control, adjustment of the mA and/or kV according to patient size and/or use of iterative reconstruction technique. CONTRAST:  OMNIPAQUE IOHEXOL 300 MG/ML  SOLN COMPARISON:  None Available. FINDINGS: Lower chest: There is crowding of markings in the posterior lower lung fields, possibly subsegmental atelectasis. In image 23 of series 5, there is 10 x 7 mm pleural-based nodule in the lateral left lower lung field in left lower lobe.  Hepatobiliary: No focal abnormalities are seen in liver. Gallbladder is unremarkable. Pancreas: No focal abnormalities are seen. Spleen: Unremarkable. Adrenals/Urinary Tract: Adrenals are unremarkable. There is no hydronephrosis. There are no renal or ureteral stones. There is mild diffuse wall thickening in the urinary bladder. Stomach/Bowel: There is fluid in the lumen of lower thoracic esophagus suggesting gastroesophageal reflux. Stomach is moderately distended. Small bowel loops are not dilated. The appendix is not dilated. There is fluid in the lumen of ascending colon. Scattered diverticula are seen in colon. There is mild diffuse wall thickening in rectosigmoid. There are few small pockets of air adjacent to the sigmoid colon in midline, possibly contained bowel perforation and extraluminal air due to trauma or acute diverticulitis or acute colitis. There is 4.9 x 3 cm serous fluid collection in the posterior pelvic cavity adjacent to the rectosigmoid. Vascular/Lymphatic: No acute findings are seen in aorta and its major branches. There are vascular coils in the course of left gonadal vein. Small scattered arterial calcifications are seen. Reproductive: Prostate is slightly enlarged. Other: There is no pneumoperitoneum in the upper abdomen. Small pockets of air adjacent to the rectosigmoid may suggest contained perforation. Bilateral inguinal hernias containing fat are seen, larger on the left side. Musculoskeletal: No acute findings are seen. IMPRESSION: There are few small pockets of air lying immediately adjacent to the rectosigmoid suggesting possible contained perforation. This may be due to trauma or related to colitis or diverticulitis. There is diffuse wall thickening in rectosigmoid. Scattered diverticula are seen in colon. There is small amount of serous fluid in pelvic cavity. There is no demonstrable  large intraperitoneal or retroperitoneal hematoma. Surgical consultation should be considered.  There is no hydronephrosis. There is no evidence of intestinal obstruction or pneumoperitoneum in the upper abdomen. There is fluid in the lumen of lower thoracic esophagus suggesting gastroesophageal reflux. There is there is mild diffuse wall thickening in the urinary bladder which may be due to incomplete distention or suggest cystitis. There is 10 mm pleural-based nodule in left lower lobe. Follow-up CT chest in 3 months may be considered. There are linear densities in the posterior lower lung fields suggesting possible subsegmental atelectasis. Imaging finding of possible contained perforation in rectosigmoid was relayed to patient's provider [facility] by telephone call. Electronically Signed   By: Ernie Avena M.D.   On: 09/09/2022 20:24   DG Abd Portable 1V  Result Date: 09/09/2022 CLINICAL DATA:  Pain, evaluate for free air. EXAM: PORTABLE ABDOMEN - 1 VIEW COMPARISON:  None Available. FINDINGS: The bowel gas pattern is normal. No free air identified. No radio-opaque calculi calculi. Embolic coils are seen in the left abdomen. IMPRESSION: Nonobstructive bowel gas pattern. Electronically Signed   By: Darliss Cheney M.D.   On: 09/09/2022 19:23    Anti-infectives: Anti-infectives (From admission, onward)    Start     Dose/Rate Route Frequency Ordered Stop   09/10/22 0400  piperacillin-tazobactam (ZOSYN) IVPB 3.375 g        3.375 g 12.5 mL/hr over 240 Minutes Intravenous Every 8 hours 09/09/22 2205     09/09/22 2030  piperacillin-tazobactam (ZOSYN) IVPB 3.375 g        3.375 g 100 mL/hr over 30 Minutes Intravenous  Once 09/09/22 2018 09/09/22 2318       Assessment/Plan: Perforation of rectum secondary to enema usage -I am going to repeat his ct this am to figure out why more tender and if he does need an operation can help determine what to do in terms of drainage, laparotomy, diversion -his wbc is up this am, remains with tachycardia  I reviewed hospitalist notes, last 24 h vitals and  pain scores, last 48 h intake and output, last 24 h labs and trends, and last 24 h imaging results.  This care required moderate level of medical decision making.    Emelia Loron 09/10/2022

## 2022-09-10 NOTE — Anesthesia Procedure Notes (Signed)
Procedure Name: Intubation Date/Time: 09/10/2022 5:44 PM  Performed by: Minerva Ends, CRNAPre-anesthesia Checklist: Patient identified, Emergency Drugs available, Suction available and Patient being monitored Patient Re-evaluated:Patient Re-evaluated prior to induction Oxygen Delivery Method: Circle System Utilized Preoxygenation: Pre-oxygenation with 100% oxygen Induction Type: IV induction, Rapid sequence and Cricoid Pressure applied Ventilation: Mask ventilation without difficulty Laryngoscope Size: Miller and 2 Grade View: Grade II Tube type: Oral Tube size: 7.5 mm Number of attempts: 1 Airway Equipment and Method: Stylet Placement Confirmation: ETT inserted through vocal cords under direct vision, positive ETCO2 and breath sounds checked- equal and bilateral Secured at: 23 cm Tube secured with: Tape Dental Injury: Teeth and Oropharynx as per pre-operative assessment  Comments: RSI Rose-- intubation AM CRNA atraumatic-- irregular surfaces on front teeth-- unchanged with intubation-- bilat BS

## 2022-09-10 NOTE — Anesthesia Postprocedure Evaluation (Signed)
Anesthesia Post Note  Patient: George Cooley  Procedure(s) Performed: EXPLORATORY LAPAROTOMY WITH COLOSTOMY     Patient location during evaluation: PACU Anesthesia Type: General Level of consciousness: awake and alert Pain management: pain level controlled Vital Signs Assessment: post-procedure vital signs reviewed and stable Respiratory status: spontaneous breathing, nonlabored ventilation, respiratory function stable and patient connected to nasal cannula oxygen Cardiovascular status: blood pressure returned to baseline and stable Postop Assessment: no apparent nausea or vomiting Anesthetic complications: no  No notable events documented.  Last Vitals:  Vitals:   09/10/22 2000 09/10/22 2015  BP: 121/87 114/81  Pulse: (!) 110 (!) 106  Resp: 17 15  Temp:    SpO2: 92% 90%    Last Pain:  Vitals:   09/10/22 2015  TempSrc:   PainSc: 8                  Makai Dumond S

## 2022-09-10 NOTE — Progress Notes (Signed)
Zoe obtained order form [facility] to transfer pt to stepdown. Assisted in transfer of pt to ICU stepdown. Report given to [facility], Charity fundraiser. Dr. Luisa Hart in to see pt shortly after arrival to ICU.

## 2022-09-10 NOTE — ED Notes (Signed)
Pt sats high 80s while pt dozing off. Pt placed on 2, [place], pt sats up to 95%

## 2022-09-10 NOTE — Op Note (Signed)
Preoperative diagnosis: Sigmoid colon perforation with peritonitis  Postoperative diagnosis: Perforation of distal sigmoid colon with peritonitis  Procedure: Exploratory laparotomy with sigmoid colectomy and end colostomy/Hartmann's procedure  Surgeon: Harriette Bouillon, MD  Anesthesia: General  EBL: 100 cc  Drains: 19 round  Specimen: Sigmoid colon with perforation noted  IV fluids: Per anesthesia record  Indications for procedure: The patient is a 42 year old male who was admitted last night due to abdominal pain.  He does self enemas at home daily.  He developed abdominal pain last night after one of his self enemas.  He was brought to the emergency room and workup revealed a perforation of his distal sigmoid and proximal rectum.  He was admitted and placed on IV antibiotics since he showed no signs of systemic illness.  This afternoon, he has become more tachycardic and his oxygen requirements have gone up.  A repeat CT scan was obtained which showed more fluid and more air in the area.  He had more abdominal and shoulder pain and I recommended exploratory laparotomy due to his worsening overall situation.  I reviewed with him the procedure, the need for a colostomy and the drain and the need for other potential additional procedures.  Risk of surgery include but not exclusive of bleeding, infection, bowel injury, bladder injury, injury to the ureter, injury to the colon, injury to other internal viscera, major blood vessel injury, poor wound healing, the use of ostomy appliances, the need for the treatments and/or procedures.   Description of procedure: The patient was met in the holding and questions answered.  He is taken back to the operating room placed supine upon the operating table.  After induction of general anesthesia, nasogastric was placed and a Foley catheter was placed.  He was then prepped and draped in sterile fashion.  Timeout performed.  He was already on antibiotics.  A  lower midline incision was made from just above the umbilicus down to the pubic symphysis.  Dissection was carried down to the midline and the midline was opened.  The abdominal cavity is entered.  There was dark turbid foul-smelling fluid encountered immediately.  Retractor was placed.  He was placed in Trendelenburg and the small bowel was packed out of the way.  Upon examination of the perforation of his distal sigmoid colon.  This was at least 10 cm above the peritoneal fold.  I then mobilized the sigmoid colon off the lateral attachments.  This was taken down to the level of perforation.  I divided the distal sigmoid colon with a GIA blue load.  Additional blue was used for proximal division of the sigmoid colon.  LigaSure was used to take the mesentery down.  Care was taken to stay as close to the bowel as possible.  Once the segment was removed it was examined and the perforation was evident.  This was passed off the field.  The pelvis was irrigated.  A 2-0 Prolene was placed in the rectal stump for identification purposes later.  The left ureter was identified and preserved.  More irrigation was used and suctioned out until clear.  Small bowel was run from the ligament of Treitz to ileocecal valve was normal.  The acing colon had considerable stool in it as well as transverse and descending colon.  Stomach was normal and NG tube in place.  After the packs were removed a end colostomy was created in the left lower quadrant.  A Kocher was used for a circular incision in the left  lower quadrant.  The subcu fat was cleared out until the aponeurosis of the rectus muscle was identified.  A cruciate incision was made.  The fibers were separated and the posterior sheath was incised in similar fashion.  A Babcock was placed in the distal descending colon was pulled through this.  We then closed the fascia with double-stranded PDS.  Skin was packed open.  Colostomy matured with 3-0 Vicryl.  Appliance applied.  Drain  placed to bulb suction and secured to the skin with 2-0 nylon.  All counts were found to be correct this portion of case.  The patient was then awoke extubated taken to recovery in stable condition.

## 2022-09-10 NOTE — Progress Notes (Addendum)
   09/10/22 1343  Assess: MEWS Score  Level of Consciousness Alert  Assess: MEWS Score  MEWS Temp 0  MEWS Systolic 0  MEWS Pulse 2  MEWS RR 0  MEWS LOC 0  MEWS Score 2  MEWS Score Color Yellow  Assess: if the MEWS score is Yellow or Red  Were vital signs taken at a resting state? Yes  Focused Assessment No change from prior assessment  Does the patient meet 2 or more of the SIRS criteria? No  Does the patient have a confirmed or suspected source of infection? Yes  Provider and Rapid Response Notified? No  MEWS guidelines implemented  Yes, yellow  Treat  MEWS Interventions Considered administering scheduled or prn medications/treatments as ordered  Take Vital Signs  Increase Vital Sign Frequency  Yellow: Q2hr x1, continue Q4hrs until patient remains green for 12hrs  Escalate  MEWS: Escalate Yellow: Discuss with charge nurse and consider notifying provider and/or RRT

## 2022-09-10 NOTE — Progress Notes (Addendum)
   09/10/22 1559  Vitals  Temp 99.3 F (37.4 C)  BP 97/71  MAP (mmHg) 79  BP Location Right Arm  BP Method Automatic  Patient Position (if appropriate) Lying  Pulse Rate (!) 114  Pulse Rate Source Monitor  Resp 18  Level of Consciousness  Level of Consciousness Alert  MEWS COLOR  MEWS Score Color Yellow  Oxygen Therapy  SpO2 (!) 89 %  O2 Device Room Air  MEWS Score  MEWS Temp 0  MEWS Systolic 1  MEWS Pulse 2  MEWS RR 0  MEWS LOC 0  MEWS Score 3  Provider Notification  Provider Name/Title Dr. Dartha Lodge  Date Provider Notified 09/10/22  Time Provider Notified 1600  Method of Notification Face-to-face  Notification Reason Change in status  Rapid Response Notification  Date Rapid Response Notified 09/10/22  Time Rapid Response Notified 1608

## 2022-09-11 ENCOUNTER — Encounter (HOSPITAL_COMMUNITY): Payer: Self-pay | Admitting: Surgery

## 2022-09-11 LAB — CBC WITH DIFFERENTIAL/PLATELET
Abs Immature Granulocytes: 0.29 10*3/uL — ABNORMAL HIGH (ref 0.00–0.07)
Basophils Absolute: 0 10*3/uL (ref 0.0–0.1)
Basophils Relative: 0 %
Eosinophils Absolute: 0 10*3/uL (ref 0.0–0.5)
Eosinophils Relative: 0 %
HCT: 41.8 % (ref 39.0–52.0)
Hemoglobin: 13.3 g/dL (ref 13.0–17.0)
Immature Granulocytes: 2 %
Lymphocytes Relative: 2 %
Lymphs Abs: 0.4 10*3/uL — ABNORMAL LOW (ref 0.7–4.0)
MCH: 29.1 pg (ref 26.0–34.0)
MCHC: 31.8 g/dL (ref 30.0–36.0)
MCV: 91.5 fL (ref 80.0–100.0)
Monocytes Absolute: 0.6 10*3/uL (ref 0.1–1.0)
Monocytes Relative: 3 %
Neutro Abs: 16.3 10*3/uL — ABNORMAL HIGH (ref 1.7–7.7)
Neutrophils Relative %: 93 %
Platelets: 160 10*3/uL (ref 150–400)
RBC: 4.57 MIL/uL (ref 4.22–5.81)
RDW: 14.1 % (ref 11.5–15.5)
WBC: 17.5 10*3/uL — ABNORMAL HIGH (ref 4.0–10.5)
nRBC: 0 % (ref 0.0–0.2)

## 2022-09-11 LAB — CULTURE, BLOOD (ROUTINE X 2)

## 2022-09-11 LAB — RENAL FUNCTION PANEL
Albumin: 3.6 g/dL (ref 3.5–5.0)
Anion gap: 14 (ref 5–15)
BUN: 21 mg/dL — ABNORMAL HIGH (ref 6–20)
CO2: 20 mmol/L — ABNORMAL LOW (ref 22–32)
Calcium: 7.5 mg/dL — ABNORMAL LOW (ref 8.9–10.3)
Chloride: 100 mmol/L (ref 98–111)
Creatinine, Ser: 1.39 mg/dL — ABNORMAL HIGH (ref 0.61–1.24)
GFR, Estimated: >60 mL/min (ref >60)
Glucose, Bld: 116 mg/dL — ABNORMAL HIGH (ref 70–99)
Phosphorus: 4.3 mg/dL (ref 2.5–4.6)
Potassium: 4.2 mmol/L (ref 3.5–5.1)
Sodium: 134 mmol/L — ABNORMAL LOW (ref 135–145)

## 2022-09-11 LAB — MAGNESIUM: Magnesium: 2.1 mg/dL (ref 1.7–2.4)

## 2022-09-11 MED ORDER — ACETAMINOPHEN 10 MG/ML IV SOLN
1000.0000 mg | Freq: Four times a day (QID) | INTRAVENOUS | Status: AC
  Administered 2022-09-11 – 2022-09-12 (×4): 1000 mg via INTRAVENOUS
  Filled 2022-09-11 (×4): qty 100

## 2022-09-11 MED ORDER — ENOXAPARIN SODIUM 40 MG/0.4ML IJ SOSY: 40.0000 mg | PREFILLED_SYRINGE | INTRAMUSCULAR | Status: DC

## 2022-09-11 MED ORDER — HEPARIN SODIUM (PORCINE) 5000 UNIT/ML IJ SOLN
5000.0000 [IU] | Freq: Three times a day (TID) | INTRAMUSCULAR | Status: DC
  Administered 2022-09-11 – 2022-09-16 (×16): 5000 [IU] via SUBCUTANEOUS
  Filled 2022-09-11 (×16): qty 1

## 2022-09-11 NOTE — Progress Notes (Signed)
1 Day Post-Op   Subjective/Chief Complaint: OR yesterday with worsening exam/scan, no complaints this am   Objective: Vital signs in last 24 hours: Temp:  [97.4 F (36.3 C)-99.3 F (37.4 C)] 99 F (37.2 C) (04/28 0345) Pulse Rate:  [90-114] 95 (04/28 0735) Resp:  [2-19] 19 (04/28 0735) BP: (94-122)/(58-87) 106/63 (04/28 0700) SpO2:  [89 %-97 %] 95 % (04/28 0735) Last BM Date : 09/09/22  Intake/Output from previous day: 04/27 0701 - 04/28 0700 In: 3572.4 [P.O.:240; I.V.:2769.7; IV Piggyback:562.7] Out: 1880 [Urine:1200; Drains:130; Blood:50] Intake/Output this shift: No intake/output data recorded.  General nad Cv regular Pulm effort normal Ab soft approp tender dressing dry, jp serosang, appliance flat   Lab Results:  Recent Labs    09/10/22 1958 09/11/22 0256  WBC 15.8* 17.5*  HGB 13.2 13.3  HCT 40.9 41.8  PLT 181 160   BMET Recent Labs    09/10/22 1958 09/11/22 0256  NA 133* 134*  K 3.7 4.2  CL 99 100  CO2 21* 20*  GLUCOSE 121* 116*  BUN 19 21*  CREATININE 1.33* 1.39*  CALCIUM 7.3* 7.5*   PT/INR No results for input(s): "LABPROT", "INR" in the last 72 hours. ABG Recent Labs    09/10/22 1655  PHART 7.4  HCO3 26.0    Studies/Results: CT ABDOMEN PELVIS W CONTRAST  Result Date: 09/10/2022 CLINICAL DATA:  Abdominal pain, bowel perforation on previous exam EXAM: CT ABDOMEN AND PELVIS WITH CONTRAST TECHNIQUE: Multidetector CT imaging of the abdomen and pelvis was performed using the standard protocol following bolus administration of intravenous contrast. RADIATION DOSE REDUCTION: This exam was performed according to the departmental dose-optimization program which includes automated exposure control, adjustment of the mA and/or kV according to patient size and/or use of iterative reconstruction technique. CONTRAST:  OMNIPAQUE IOHEXOL 300 MG/ML  SOLN COMPARISON:  09/09/2022 FINDINGS: Lower chest: Hypoventilatory changes are seen at the lung bases.  No effusion or pneumothorax. Hepatobiliary: Vicarious excretion of contrast within the gallbladder. No cholelithiasis or cholecystitis. The liver is stable. Pancreas: Unremarkable. No pancreatic ductal dilatation or surrounding inflammatory changes. Spleen: Normal in size without focal abnormality. Adrenals/Urinary Tract: Excreted contrast within the bladder from previous CT. No filling defects. Kidneys enhance normally. The adrenals are unremarkable. Stomach/Bowel: Since the prior exam, there is progressive wall thickening of the rectosigmoid colon. Increased free fluid and free gas is seen adjacent to the thickened sigmoid colon within the lower pelvis, reference image 87/2, consistent with perforated sigmoid colon. No drainable fluid collection or abscess. No bowel obstruction or ileus. 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 There are no significant vascular findings. No pathologic adenopathy. Reproductive: Prostate is unremarkable. Other: There is increasing free fluid within the lower pelvis, without evidence of abscess. There is marked increased extraluminal gas within the lower pelvis and throughout the peritoneal cavity, consistent with perforated sigmoid colon. No abdominal wall hernia. Musculoskeletal: No acute or destructive bony lesions. Reconstructed images demonstrate no additional findings. IMPRESSION: 1. Perforated sigmoid diverticulitis or colitis is again noted, with increased free fluid and free gas since prior exam. Progressive colonic wall thickening. No drainable fluid collection or abscess at this time. 2. Worsening bibasilar hypoventilatory changes. Multiple attempts were made to contact the ordering or covering physician, without success. Therefore these results will be called to the ordering clinician or representative by the Radiologist Assistant, and communication documented in the PACS or [employer]. Electronically Signed   By: Sharlet Salina M.D.    On:  09/10/2022 15:19   CT ABDOMEN PELVIS W CONTRAST  Result Date: 09/09/2022 CLINICAL DATA:  Lower abdominal pain EXAM: CT ABDOMEN AND PELVIS WITH CONTRAST TECHNIQUE: Multidetector CT imaging of the abdomen and pelvis was performed using the standard protocol following bolus administration of intravenous contrast. RADIATION DOSE REDUCTION: This exam was performed according to the departmental dose-optimization program which includes automated exposure control, adjustment of the mA and/or kV according to patient size and/or use of iterative reconstruction technique. CONTRAST:  OMNIPAQUE IOHEXOL 300 MG/ML  SOLN COMPARISON:  None Available. FINDINGS: Lower chest: There is crowding of markings in the posterior lower lung fields, possibly subsegmental atelectasis. In image 23 of series 5, there is 10 x 7 mm pleural-based nodule in the lateral left lower lung field in left lower lobe. Hepatobiliary: No focal abnormalities are seen in liver. Gallbladder is unremarkable. Pancreas: No focal abnormalities are seen. Spleen: Unremarkable. Adrenals/Urinary Tract: Adrenals are unremarkable. There is no hydronephrosis. There are no renal or ureteral stones. There is mild diffuse wall thickening in the urinary bladder. Stomach/Bowel: There is fluid in the lumen of lower thoracic esophagus suggesting gastroesophageal reflux. Stomach is moderately distended. Small bowel loops are not dilated. The appendix is not dilated. There is fluid in the lumen of ascending colon. Scattered diverticula are seen in colon. There is mild diffuse wall thickening in rectosigmoid. There are few small pockets of air adjacent to the sigmoid colon in midline, possibly contained bowel perforation and extraluminal air due to trauma or acute diverticulitis or acute colitis. There is 4.9 x 3 cm serous fluid collection in the posterior pelvic cavity adjacent to the rectosigmoid. Vascular/Lymphatic: No acute findings are seen in aorta and its major  branches. There are vascular coils in the course of left gonadal vein. Small scattered arterial calcifications are seen. Reproductive: Prostate is slightly enlarged. Other: There is no pneumoperitoneum in the upper abdomen. Small pockets of air adjacent to the rectosigmoid may suggest contained perforation. Bilateral inguinal hernias containing fat are seen, larger on the left side. Musculoskeletal: No acute findings are seen. IMPRESSION: There are few small pockets of air lying immediately adjacent to the rectosigmoid suggesting possible contained perforation. This may be due to trauma or related to colitis or diverticulitis. There is diffuse wall thickening in rectosigmoid. Scattered diverticula are seen in colon. There is small amount of serous fluid in pelvic cavity. There is no demonstrable large intraperitoneal or retroperitoneal hematoma. Surgical consultation should be considered. There is no hydronephrosis. There is no evidence of intestinal obstruction or pneumoperitoneum in the upper abdomen. There is fluid in the lumen of lower thoracic esophagus suggesting gastroesophageal reflux. There is there is mild diffuse wall thickening in the urinary bladder which may be due to incomplete distention or suggest cystitis. There is 10 mm pleural-based nodule in left lower lobe. Follow-up CT chest in 3 months may be considered. There are linear densities in the posterior lower lung fields suggesting possible subsegmental atelectasis. Imaging finding of possible contained perforation in rectosigmoid was relayed to patient's provider [facility] by telephone call. Electronically Signed   By: Ernie Avena M.D.   On: 09/09/2022 20:24   DG Abd Portable 1V  Result Date: 09/09/2022 CLINICAL DATA:  Pain, evaluate for free air. EXAM: PORTABLE ABDOMEN - 1 VIEW COMPARISON:  None Available. FINDINGS: The bowel gas pattern is normal. No free air identified. No radio-opaque calculi calculi. Embolic coils are seen in the left  abdomen. IMPRESSION: Nonobstructive bowel gas pattern. Electronically Signed   By:  Darliss Cheney M.D.   On: 09/09/2022 19:23    Anti-infectives: Anti-infectives (From admission, onward)    Start     Dose/Rate Route Frequency Ordered Stop   09/10/22 1700  ceFAZolin (ANCEF) IVPB 3g/100 mL premix        3 g 200 mL/hr over 30 Minutes Intravenous On call to O.R. 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

## 2022-09-12 LAB — CULTURE, BLOOD (ROUTINE X 2)
Culture: NO GROWTH
Special Requests: ADEQUATE

## 2022-09-12 MED ORDER — ACETAMINOPHEN 10 MG/ML IV SOLN
1000.0000 mg | Freq: Four times a day (QID) | INTRAVENOUS | Status: AC
  Administered 2022-09-12 – 2022-09-13 (×4): 1000 mg via INTRAVENOUS
  Filled 2022-09-12 (×4): qty 100

## 2022-09-12 MED ORDER — METHOCARBAMOL 1000 MG/10ML IJ SOLN
500.0000 mg | Freq: Three times a day (TID) | INTRAVENOUS | Status: DC
  Administered 2022-09-12 – 2022-09-15 (×9): 500 mg via INTRAVENOUS
  Filled 2022-09-12 (×2): qty 500
  Filled 2022-09-12: qty 5
  Filled 2022-09-12 (×6): qty 500
  Filled 2022-09-12: qty 5

## 2022-09-12 NOTE — Progress Notes (Signed)
2 Days Post-Op   Subjective/Chief Complaint: Some pain as expected.  NGT with 700cc of output.  Hasn't mobilized yet.  Pain seems well controlled at the moment.  Foley in place.   Objective: Vital signs in last 24 hours: Temp:  [98 F (36.7 C)-98.6 F (37 C)] 98.2 F (36.8 C) (04/28 2335) Pulse Rate:  [81-98] 86 (04/29 0700) Resp:  [8-18] 18 (04/29 0759) BP: (95-125)/(59-89) 113/89 (04/29 0700) SpO2:  [88 %-100 %] 97 % (04/29 0759) FiO2 (%):  [21 %] 21 % (04/29 0759) Weight:  [97.5 kg] 97.5 kg (04/28 1622) Last BM Date : 09/09/22  Intake/Output from previous day: 04/28 0701 - 04/29 0700 In: 852.8 [I.V.:452.4; NG/GT:60; IV Piggyback:340.4] Out: 1645 [Urine:900; Emesis/NG output:700; Drains:45] Intake/Output this shift: No intake/output data recorded.  General nad Cv regular Pulm effort normal Abd: soft approp tender, wound is clean and packed, jp serosang, appliance flat, stoma is pink and viable, no output   Lab Results:  Recent Labs    09/10/22 1958 09/11/22 0256  WBC 15.8* 17.5*  HGB 13.2 13.3  HCT 40.9 41.8  PLT 181 160   BMET Recent Labs    09/10/22 1958 09/11/22 0256  NA 133* 134*  K 3.7 4.2  CL 99 100  CO2 21* 20*  GLUCOSE 121* 116*  BUN 19 21*  CREATININE 1.33* 1.39*  CALCIUM 7.3* 7.5*   PT/INR No results for input(s): "LABPROT", "INR" in the last 72 hours. ABG Recent Labs    09/10/22 1655  PHART 7.4  HCO3 26.0    Studies/Results: CT ABDOMEN PELVIS W CONTRAST  Result Date: 09/10/2022 CLINICAL DATA:  Abdominal pain, bowel perforation on previous exam EXAM: CT ABDOMEN AND PELVIS WITH CONTRAST TECHNIQUE: Multidetector CT imaging of the abdomen and pelvis was performed using the standard protocol following bolus administration of intravenous contrast. RADIATION DOSE REDUCTION: This exam was performed according to the departmental dose-optimization program which includes automated exposure control, adjustment of the mA and/or kV according to  patient size and/or use of iterative reconstruction technique. CONTRAST:  OMNIPAQUE IOHEXOL 300 MG/ML  SOLN COMPARISON:  09/09/2022 FINDINGS: Lower chest: Hypoventilatory changes are seen at the lung bases. No effusion or pneumothorax. Hepatobiliary: Vicarious excretion of contrast within the gallbladder. No cholelithiasis or cholecystitis. The liver is stable. Pancreas: Unremarkable. No pancreatic ductal dilatation or surrounding inflammatory changes. Spleen: Normal in size without focal abnormality. Adrenals/Urinary Tract: Excreted contrast within the bladder from previous CT. No filling defects. Kidneys enhance normally. The adrenals are unremarkable. Stomach/Bowel: Since the prior exam, there is progressive wall thickening of the rectosigmoid colon. Increased free fluid and free gas is seen adjacent to the thickened sigmoid colon within the lower pelvis, reference image 87/2, consistent with perforated sigmoid colon. No drainable fluid collection or abscess. No bowel obstruction or ileus. 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 There are no significant vascular findings. No pathologic adenopathy. Reproductive: Prostate is unremarkable. Other: There is increasing free fluid within the lower pelvis, without evidence of abscess. There is marked increased extraluminal gas within the lower pelvis and throughout the peritoneal cavity, consistent with perforated sigmoid colon. No abdominal wall hernia. Musculoskeletal: No acute or destructive bony lesions. Reconstructed images demonstrate no additional findings. IMPRESSION: 1. Perforated sigmoid diverticulitis or colitis is again noted, with increased free fluid and free gas since prior exam. Progressive colonic wall thickening. No drainable fluid collection or abscess at this time. 2. Worsening bibasilar hypoventilatory changes. Multiple attempts were made to contact  the ordering or covering physician, without success. Therefore  these results will be called to the ordering clinician or representative by the Radiologist Assistant, and communication documented in the PACS or [employer]. Electronically Signed   By: Sharlet Salina M.D.   On: 09/10/2022 15:19    Anti-infectives: Anti-infectives (From admission, onward)    Start     Dose/Rate Route Frequency Ordered Stop   09/10/22 1700  ceFAZolin (ANCEF) IVPB 3g/100 mL premix        3 g 200 mL/hr over 30 Minutes Intravenous On call to O.R. 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

## 2022-09-12 NOTE — Plan of Care (Signed)

## 2022-09-12 NOTE — Consult Note (Signed)
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  WOC nursing team will follow, and will remain available to this patient, the nursing and medical teams.    Thank you for inviting [place] to participate in this patient's Plan of Care.  Ladona Mow, MSN, RN, CNS, GNP, Leda Min, [employer], [employer] phone:  [phone]

## 2022-09-12 NOTE — Consult Note (Signed)
WOC Nurse ostomy consult note Stoma type/location: RLQ, end colostomy Stomal assessment/size: 1 3/8" slightly oval shaped, pink, moist, budded Peristomal assessment: intact  Treatment options for stomal/peristomal skin: 2" skin barrier ring Output bloody Ostomy pouching: 2pc. 2 3/4" with 2" skin barrier ring Education provided:  Explained role of ostomy nurse and creation of stoma  Explained stoma characteristics (budded, flush, color, texture, care) Demonstrated pouch change (cutting new skin barrier, measuring stoma, cleaning peristomal skin and stoma, use of barrier ring) Education on how to empty    Enrolled patient in [employer] DC program: Yes Educational folder left in room, ostomy clinic information, Insurance account manager.   1 extra 2pc pouching with barrier rings.  Will ask unit secretary to order additional supplies  Trying to arrange teaching visit with patient's wife, he requested Tuesday which I would not normally recommend changing again that soon but if that works for her I will see him then.  Working on visit either Tuesday or Wednesday.   WOC Nurse will follow along with you for continued support with ostomy teaching and care Rae Sutcliffe [facility], RN, [place], CNS, [place] [phone]

## 2022-09-13 LAB — CULTURE, BLOOD (ROUTINE X 2): Culture: NO GROWTH

## 2022-09-13 LAB — SURGICAL PATHOLOGY

## 2022-09-13 LAB — CBC
HCT: 38.2 % — ABNORMAL LOW (ref 39.0–52.0)
Hemoglobin: 12.3 g/dL — ABNORMAL LOW (ref 13.0–17.0)
MCH: 28.9 pg (ref 26.0–34.0)
MCHC: 32.2 g/dL (ref 30.0–36.0)
MCV: 89.9 fL (ref 80.0–100.0)
Platelets: 234 10*3/uL (ref 150–400)
RBC: 4.25 MIL/uL (ref 4.22–5.81)
RDW: 13.5 % (ref 11.5–15.5)
WBC: 9.9 10*3/uL (ref 4.0–10.5)
nRBC: 0 % (ref 0.0–0.2)

## 2022-09-13 LAB — BASIC METABOLIC PANEL
Anion gap: 13 (ref 5–15)
BUN: 16 mg/dL (ref 6–20)
CO2: 22 mmol/L (ref 22–32)
Calcium: 7.7 mg/dL — ABNORMAL LOW (ref 8.9–10.3)
Chloride: 96 mmol/L — ABNORMAL LOW (ref 98–111)
Creatinine, Ser: 1.06 mg/dL (ref 0.61–1.24)
GFR, Estimated: >60 mL/min (ref >60)
Glucose, Bld: 78 mg/dL (ref 70–99)
Potassium: 3.3 mmol/L — ABNORMAL LOW (ref 3.5–5.1)
Sodium: 131 mmol/L — ABNORMAL LOW (ref 135–145)

## 2022-09-13 MED ORDER — POTASSIUM CHLORIDE 10 MEQ/100ML IV SOLN
10.0000 meq | INTRAVENOUS | Status: AC
  Administered 2022-09-13 (×6): 10 meq via INTRAVENOUS
  Filled 2022-09-13 (×6): qty 100

## 2022-09-13 MED ORDER — POTASSIUM CHLORIDE 2 MEQ/ML IV SOLN: INTRAVENOUS | Status: DC

## 2022-09-13 MED ORDER — KCL IN DEXTROSE-NACL 20-5-0.45 MEQ/L-%-% IV SOLN
INTRAVENOUS | Status: DC
  Filled 2022-09-13 (×4): qty 1000

## 2022-09-13 MED ORDER — TAMSULOSIN HCL 0.4 MG PO CAPS
0.4000 mg | ORAL_CAPSULE | Freq: Every day | ORAL | Status: DC
  Administered 2022-09-13 – 2022-09-16 (×4): 0.4 mg via ORAL
  Filled 2022-09-13 (×4): qty 1

## 2022-09-13 MED ORDER — ACETAMINOPHEN 10 MG/ML IV SOLN
1000.0000 mg | Freq: Four times a day (QID) | INTRAVENOUS | Status: AC
  Administered 2022-09-13 – 2022-09-14 (×4): 1000 mg via INTRAVENOUS
  Filled 2022-09-13 (×4): qty 100

## 2022-09-13 NOTE — Progress Notes (Signed)
3 Days Post-Op   Subjective/Chief Complaint: Voiding some on his own.  Feels like he might still be retaining some after voiding.  No bowel function yet.  Pain seems well controlled.   Objective: Vital signs in last 24 hours: Temp:  [98.4 F (36.9 C)-98.8 F (37.1 C)] 98.6 F (37 C) (04/30 0840) Pulse Rate:  [77-94] 92 (04/30 0840) Resp:  [10-18] 18 (04/30 0856) BP: (112-129)/(74-85) 121/82 (04/30 0840) SpO2:  [94 %-100 %] 100 % (04/30 0856) FiO2 (%):  [0 %-21 %] 0 % (04/30 0856) Last BM Date : 09/09/22  Intake/Output from previous day: 04/29 0701 - 04/30 0700 In: 2058.9 [I.V.:1390.7; IV Piggyback:668.3] Out: 3400 [Urine:2350; Emesis/NG output:1000; Drains:50] Intake/Output this shift: Total I/O In: -  Out: 130 [Emesis/NG output:100; Drains:30]  Pulm effort normal Abd: soft approp tender, wound is clean and packed, jp serosang, appliance flat, stoma is pink and viable, no output   Lab Results:  Recent Labs    09/11/22 0256 09/13/22 0502  WBC 17.5* 9.9  HGB 13.3 12.3*  HCT 41.8 38.2*  PLT 160 234   BMET Recent Labs    09/11/22 0256 09/13/22 0502  NA 134* 131*  K 4.2 3.3*  CL 100 96*  CO2 20* 22  GLUCOSE 116* 78  BUN 21* 16  CREATININE 1.39* 1.06  CALCIUM 7.5* 7.7*   PT/INR No results for input(s): "LABPROT", "INR" in the last 72 hours. ABG Recent Labs    09/10/22 1655  PHART 7.4  HCO3 26.0    Studies/Results: No results found.  Anti-infectives: Anti-infectives (From admission, onward)    Start     Dose/Rate Route Frequency Ordered Stop   09/10/22 1700  ceFAZolin (ANCEF) IVPB 3g/100 mL premix        3 g 200 mL/hr over 30 Minutes Intravenous On call to O.R. 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  flomax -scheduled IV tylenol, robaxin, PCA for now -path pending- never had csc should get one when this resolves before takedown  FEN - NPO/NGT/IVFs VTE -  heparin ID - zosyn  AKI- resolved Home meds with sips  Letha Cape, PA-C 09/13/2022

## 2022-09-13 NOTE — Consult Note (Signed)
WOC Nurse ostomy follow up Stoma type/location: LLQ, end colostomy  Stomal assessment/size: 1 3/8" slightly oval, budded, pink, moist Peristomal assessment: intact  Treatment options for stomal/peristomal skin: 2" skin barrier ring Output; none Ostomy pouching: 2pc. 2 3/4" with 2" skin barrier ring  Education provided:  Met with patient and his wife today Explained role of ostomy nurse and creation of stoma  Explained stoma characteristics (budded, flush, color, texture, care) Demonstrated pouch change (cutting new skin barrier, measuring stoma, cleaning peristomal skin and stoma, use of barrier ring) Education on emptying when 1/3 to 1/2 full and how to empty Demonstrated "burping" flatus from pouch Demonstrated use of wick to clean spout  Discussed bathing, diet, gas, ways to limit gas Traveling with an ostomy  Discussed risk of peristomal hernia Provided patient with [employer] and marked items currently using Answered patient/family questions:   Allowed patient to open and close lock and roll closure Marked QR codes appropriate for patient and wife to view Discussed ostomy clinic resource Discussed primary goal is for patient to be as independent with his own care Discussed that enemas would not be needed any longer via rectum and would be unsafe for the patient 3 pouching systems left in the room/3 barrier rings/pattern    Enrolled patient in [place] Secure Start Discharge program: Yes  WOC Nurse will follow along with you for continued support with ostomy teaching and care Blondell Laperle [facility] MSN, RN, [place], CNS, [place] [phone]

## 2022-09-14 LAB — CBC
HCT: 43.8 % (ref 39.0–52.0)
Hemoglobin: 13.3 g/dL (ref 13.0–17.0)
MCH: 28.8 pg (ref 26.0–34.0)
MCHC: 30.4 g/dL (ref 30.0–36.0)
MCV: 94.8 fL (ref 80.0–100.0)
Platelets: 193 10*3/uL (ref 150–400)
RBC: 4.62 MIL/uL (ref 4.22–5.81)
RDW: 13.4 % (ref 11.5–15.5)
WBC: 9.3 10*3/uL (ref 4.0–10.5)
nRBC: 0 % (ref 0.0–0.2)

## 2022-09-14 LAB — BASIC METABOLIC PANEL
Anion gap: 11 (ref 5–15)
BUN: 10 mg/dL (ref 6–20)
CO2: 20 mmol/L — ABNORMAL LOW (ref 22–32)
Calcium: 8 mg/dL — ABNORMAL LOW (ref 8.9–10.3)
Chloride: 101 mmol/L (ref 98–111)
Creatinine, Ser: 0.84 mg/dL (ref 0.61–1.24)
GFR, Estimated: >60 mL/min (ref >60)
Glucose, Bld: 113 mg/dL — ABNORMAL HIGH (ref 70–99)
Potassium: 3.3 mmol/L — ABNORMAL LOW (ref 3.5–5.1)
Sodium: 132 mmol/L — ABNORMAL LOW (ref 135–145)

## 2022-09-14 MED ORDER — OXYCODONE HCL 5 MG PO TABS
5.0000 mg | ORAL_TABLET | ORAL | Status: DC | PRN
  Administered 2022-09-16: 10 mg via ORAL
  Filled 2022-09-14: qty 2

## 2022-09-14 MED ORDER — KETOROLAC TROMETHAMINE 30 MG/ML IJ SOLN
30.0000 mg | Freq: Three times a day (TID) | INTRAMUSCULAR | Status: DC
  Administered 2022-09-14 – 2022-09-16 (×7): 30 mg via INTRAVENOUS
  Filled 2022-09-14 (×7): qty 1

## 2022-09-14 MED ORDER — ACETAMINOPHEN 500 MG PO TABS
1000.0000 mg | ORAL_TABLET | Freq: Four times a day (QID) | ORAL | Status: DC
  Administered 2022-09-14 – 2022-09-16 (×6): 1000 mg via ORAL
  Filled 2022-09-14 (×7): qty 2

## 2022-09-14 MED ORDER — HYDROMORPHONE HCL 1 MG/ML IJ SOLN: 0.5000 mg | INTRAMUSCULAR | Status: DC | PRN

## 2022-09-14 NOTE — Progress Notes (Signed)
4 Days Post-Op   Subjective/Chief Complaint: Starting to pass flatus into bag.  Still complains of fatigue.  Only walked once yesterday.  Ready to stop PCA   Objective: Vital signs in last 24 hours: Temp:  [97.9 F (36.6 C)-98.6 F (37 C)] 97.9 F (36.6 C) (05/01 0541) Pulse Rate:  [71-92] 71 (05/01 0541) Resp:  [13-19] 16 (05/01 0745) BP: (121-129)/(82-87) 128/84 (05/01 0541) SpO2:  [96 %-100 %] 98 % (05/01 0745) FiO2 (%):  [0 %-98 %] 97 % (05/01 0402) Last BM Date : 09/09/22  Intake/Output from previous day: 04/30 0701 - 05/01 0700 In: 4081.5 [I.V.:1600.7; IV Piggyback:2480.8] Out: 2755 [Urine:2560; Emesis/NG output:130; Drains:65] Intake/Output this shift: No intake/output data recorded.  PE: Abd: soft approp tender, wound is clean and packed, jp serosang 65cc, stoma is pink and viable.  Bag with air present.  No stool yet   Lab Results:  Recent Labs    09/13/22 0502 09/14/22 0506  WBC 9.9 9.3  HGB 12.3* 13.3  HCT 38.2* 43.8  PLT 234 193   BMET Recent Labs    09/13/22 0502 09/14/22 0506  NA 131* 132*  K 3.3* 3.3*  CL 96* 101  CO2 22 20*  GLUCOSE 78 113*  BUN 16 10  CREATININE 1.06 0.84  CALCIUM 7.7* 8.0*   PT/INR No results for input(s): "LABPROT", "INR" in the last 72 hours. ABG No results for input(s): "PHART", "HCO3" in the last 72 hours.  Invalid input(s): "PCO2", "PO2"   Studies/Results: No results found.  Anti-infectives: Anti-infectives (From admission, onward)    Start     Dose/Rate Route Frequency Ordered Stop   09/10/22 1700  ceFAZolin (ANCEF) IVPB 3g/100 mL premix        3 g 200 mL/hr over 30 Minutes Intravenous On call to O.R. 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  DC NGT and start CLD -dressing changes BID, woc consult for colostomy teaching -oob, mobilize, pulm toilet -voiding improved.  flomax -Dc PCA, tylenol, toradol, oxy prn, robaxin, dilaudid prn severe pain -path: focal mucosal necrosis, no malignancy  FEN - CLD/IVFs VTE -  heparin ID - zosyn x5 days  AKI- resolved Home meds resumed  Letha Cape, PA-C 09/14/2022

## 2022-09-14 NOTE — Plan of Care (Signed)

## 2022-09-14 NOTE — Progress Notes (Signed)
Patient alert and oriented x 4. Speech is clear. No known allergies. Full Code. Moderate Fall Risk. Needs assistance with ADLs. Temp: 98.3, HR: 70, O2: 100 at RA, RR: 18, BP: 119/88. Skin clean, dry, and intact. Abdominal incision dressing was changed with wet and dry 4x4 and an ABD pad. Colostomy bag is intact. Stoma is pink and intact.Distended abdomen. Active bowel sounds within all quadrants. Nontender. Passing Flatus. Last BM 09/09/22. Removed NG tube. Patient voided clear yellow urine into urinal. S1 and S2 heard. Regular rate and rhythm. No murmurs. Lung sounds are regular, unlabored, and symmetrical. IV site at right anterior forearm is dry and intact. Surgical site pain is 3/10.  Mel Almond, Student RN

## 2022-09-14 NOTE — Progress Notes (Signed)
Wet to Dry wound dressing complete

## 2022-09-15 LAB — CULTURE, BLOOD (ROUTINE X 2)

## 2022-09-15 MED ORDER — HYDROCORTISONE 0.5 % EX CREA
TOPICAL_CREAM | Freq: Two times a day (BID) | CUTANEOUS | Status: DC
  Administered 2022-09-15: 1 via TOPICAL
  Filled 2022-09-15: qty 28.35

## 2022-09-15 MED ORDER — POTASSIUM CHLORIDE CRYS ER 20 MEQ PO TBCR
40.0000 meq | EXTENDED_RELEASE_TABLET | Freq: Once | ORAL | Status: AC
  Administered 2022-09-15: 40 meq via ORAL
  Filled 2022-09-15: qty 2

## 2022-09-15 MED ORDER — PANTOPRAZOLE SODIUM 40 MG PO TBEC
40.0000 mg | DELAYED_RELEASE_TABLET | Freq: Every day | ORAL | Status: DC
  Administered 2022-09-15 – 2022-09-16 (×2): 40 mg via ORAL
  Filled 2022-09-15 (×2): qty 1

## 2022-09-15 MED ORDER — METHOCARBAMOL 500 MG PO TABS
500.0000 mg | ORAL_TABLET | Freq: Three times a day (TID) | ORAL | Status: DC
  Administered 2022-09-15 – 2022-09-16 (×4): 500 mg via ORAL
  Filled 2022-09-15 (×4): qty 1

## 2022-09-15 MED ORDER — HYDROMORPHONE HCL 1 MG/ML IJ SOLN: 0.5000 mg | INTRAMUSCULAR | Status: DC | PRN

## 2022-09-15 NOTE — Discharge Instructions (Signed)
WOUND CARE: - midline dressing to be changed daily - supplies: sterile saline, gauze, scissors, tape  - remove dressing and all packing carefully, moistening with sterile saline as needed to avoid packing/internal dressing sticking to the wound. - clean edges of skin around the wound with water/gauze, making sure there is no tape debris or leakage left on skin that could cause skin irritation or breakdown. - dampen and clean gauze with sterile saline and pack wound from wound base to skin level, making sure to take note of any possible areas of wound tracking, tunneling and packing appropriately. Wound can be packed loosely. Trim gauze to size if a whole gauze is not required. - cover wound with a dry gauze and secure with tape.  - write the date/time on the dry dressing/tape to better track when the last dressing change occurred. - change dressing as needed if leakage occurs, wound gets contaminated, or patient requests to shower. - patient may shower daily with wound open (i.e. remove all packing) and following the shower the wound should be dried and a clean dressing placed.    [facility]      [facility] [phone]  OPEN ABDOMINAL SURGERY: POST OP INSTRUCTIONS  Always review your discharge instruction sheet given to you by the facility where your surgery was performed.  IF YOU HAVE DISABILITY OR FAMILY LEAVE FORMS, YOU MUST BRING THEM TO THE OFFICE FOR PROCESSING.  PLEASE DO NOT GIVE THEM TO YOUR DOCTOR.  A prescription for pain medication may be given to you upon discharge.  Take your pain medication as prescribed, if needed.  If narcotic pain medicine is not needed, then you may take acetaminophen (Tylenol) or ibuprofen (Advil) as needed. Take your usually prescribed medications unless otherwise directed. If you need a refill on your pain medication, please contact your pharmacy. They will contact our office to request authorization.  Prescriptions will not be filled after  5pm or on week-ends. You should follow a light diet the first few days after arrival home, such as soup and crackers, pudding, etc.unless your doctor has advised otherwise. A high-fiber, low fat diet can be resumed as tolerated.   Be sure to include lots of fluids daily. Most patients will experience some swelling and bruising on the chest and neck area.  Ice packs will help.  Swelling and bruising can take several days to resolve Most patients will experience some swelling and bruising in the area of the incision. Ice pack will help. Swelling and bruising can take several days to resolve..  It is common to experience some constipation if taking pain medication after surgery.  Increasing fluid intake and taking a stool softener will usually help or prevent this problem from occurring.  A mild laxative (Milk of Magnesia or Miralax) should be taken according to package directions if there are no bowel movements after 48 hours.  You may have steri-strips (small skin tapes) in place directly over the incision.  These strips should be left on the skin for 7-10 days.  If your surgeon used skin glue on the incision, you may shower in 24 hours.  The glue will flake off over the next 2-3 weeks.  Any sutures or staples will be removed at the office during your follow-up visit. You may find that a light gauze bandage over your incision may keep your staples from being rubbed or pulled. You may shower and replace the bandage daily. ACTIVITIES:  You may resume regular (light) daily activities beginning the   next day--such as daily self-care, walking, climbing stairs--gradually increasing activities as tolerated.  You may have sexual intercourse when it is comfortable.  Refrain from any heavy lifting or straining until approved by your doctor. You may drive when you no longer are taking prescription pain medication, you can comfortably wear a seatbelt, and you can safely maneuver your car and apply brakes Return to Work:  ___________________________________ You should see your doctor in the office for a follow-up appointment approximately two weeks after your surgery.  Make sure that you call for this appointment within a day or two after you arrive home to insure a convenient appointment time. OTHER INSTRUCTIONS:  _____________________________________________________________ _____________________________________________________________  WHEN TO CALL YOUR DOCTOR: Fever over 101.0 Inability to urinate Nausea and/or vomiting Extreme swelling or bruising Continued bleeding from incision. Increased pain, redness, or drainage from the incision. Difficulty swallowing or breathing Muscle cramping or spasms. Numbness or tingling in hands or feet or around lips.  The clinic staff is available to answer your questions during regular business hours.  Please don't hesitate to call and ask to speak to one of the nurses if you have concerns.  For further questions, please visit [email or web address]  

## 2022-09-15 NOTE — Consult Note (Signed)
WOC Nurse ostomy follow up Stoma type/location: LLQ, end colostomy  Stomal assessment/size: 1 3/8" oval shaped, budded, pink, moist  Peristomal assessment: intact; to small granulomas at 9 o'clock  Treatment options for stomal/peristomal skin: 2" skin barrier ring Output semi formed stool  Ostomy pouching: 2pc. 2 3/4" with 2" skin barrier ring Education provided:  George Cooley performed most of his pouch change today including cutting the skin barrier, use of lock and roll closure, removal of his pouching system, cleaning his skin, placement of barrier ring (with minimal assistance), placement of new skin barrier, attaching pouch to the skin barrier with some assistance. Reports he feels comfortable with pouch change Reviewed diet, exercise and activity restrictions, showering, medications Discussed intimacy, patient asks appropriate questions Reviewed risk for hernia.  Enrolled patient in [place] Secure Start Discharge program: Yes  Plans for DC tomorrow.  TOC working on [employer].  I have provided [facility] catelog with items marked for patient.   7 pouching sets and barrier rings, pattern, scissors package together in the patients room for DC  WOC Nurse will follow along with you for continued support with ostomy teaching and care Roxie Kreeger [facility] MSN, RN, [place], CNS, [place] [phone]

## 2022-09-15 NOTE — Progress Notes (Signed)
5 Days Post-Op   Subjective/Chief Complaint: Having some bowel function.  Ambulated well yesterday.  Pain seems well controlled.     Objective: Vital signs in last 24 hours: Temp:  [97.6 F (36.4 C)-98.3 F (36.8 C)] 98.3 F (36.8 C) (05/02 1342) Pulse Rate:  [70-78] 70 (05/02 1342) Resp:  [15-18] 18 (05/02 1342) BP: (118-127)/(84-88) 127/87 (05/02 1342) SpO2:  [98 %-100 %] 98 % (05/02 1342) Last BM Date : 09/09/22  Intake/Output from previous day: 05/01 0701 - 05/02 0700 In: 1550.9 [P.O.:600; I.V.:837.3; IV Piggyback:113.6] Out: 3615 [Urine:3525; Drains:60; Stool:30] Intake/Output this shift: Total I/O In: 2151.5 [P.O.:240; I.V.:1663.4; IV Piggyback:248.1] Out: 1950 [Urine:1900; Drains:50]  PE: Abd: soft approp tender, wound is clean and packed, jp serosang 50cc, stoma is pink and viable.  Bag with air present.  Small amount of stool present.   Lab Results:  Recent Labs    09/13/22 0502 09/14/22 0506  WBC 9.9 9.3  HGB 12.3* 13.3  HCT 38.2* 43.8  PLT 234 193   BMET Recent Labs    09/13/22 0502 09/14/22 0506  NA 131* 132*  K 3.3* 3.3*  CL 96* 101  CO2 22 20*  GLUCOSE 78 113*  BUN 16 10  CREATININE 1.06 0.84  CALCIUM 7.7* 8.0*   PT/INR No results for input(s): "LABPROT", "INR" in the last 72 hours. ABG No results for input(s): "PHART", "HCO3" in the last 72 hours.  Invalid input(s): "PCO2", "PO2"   Studies/Results: No results found.  Anti-infectives: Anti-infectives (From admission, onward)    Start     Dose/Rate Route Frequency Ordered Stop   09/10/22 1700  ceFAZolin (ANCEF) IVPB 3g/100 mL premix        3 g 200 mL/hr over 30 Minutes Intravenous On call to O.R. 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  Tolerating CLD, adv to FLD and soft for dinner if he does well with FLD. -dressing changes BID, woc consult for colostomy teaching.  RN to teach wife how to do dressing changes as well -HH ordered so if we can get some help for him at home then we can, but if not, then wife and patient will be prepared. -oob, mobilize, pulm toilet -tylenol, toradol, oxy prn, robaxin, dilaudid prn severe pain -path: focal mucosal necrosis, no malignancy -if does well today, anticipate DC home tomorrow -zosyn to stop tonight   FEN - FLD ADAT to soft/SLIV VTE -  heparin ID - zosyn x5 days  AKI- resolved Home meds resumed  Letha Cape, PA-C 09/15/2022

## 2022-09-15 NOTE — TOC Initial Note (Addendum)
Transition of Care [facility]) - Initial/Assessment Note    Patient Details  Name: George Cooley MRN: [id number] Date of Birth: Feb 29, 1981  [facility] CM/SW Contact:    Adrian Prows, RN Phone Number: 09/15/2022, 11:12 AM  Clinical Narrative:                 [facility] consult for [facility]; spoke w/ pt in room; he does not have an agency preference; LVM for Belenda Cruise at [facility]; awaiting return call; Frances Furbish unable to provide services per [facility], awaiting responses from [place]; [facility], and [place].   -1130- Cory at [place] unable to staff  -1139- Amy at [facility] unable to accept due to staffing  -1320Morrie Sheldon at [employer]; out of network  -1325- Tourist information centre manager at [employer]; she will see if agency can accept pt; awaiting response   -1333- LVM for [employer] at [place]; awaiting return call  -1328- Contacted Christian at [place]; awaiting response  -1329- Contacted Cheryl at [facility]; awaiting response  -1357- Notified by Ephriam Knuckles at [facility] unable to provide services  -1421- notified by Tresa Endo at [place] agency can provide services w/ start of care on Monday  Barnetta Chapel, [place] notified and is ok w/ this start of care date; pt notified; contact information placed in follow up provider section of d/c instructions; no TOC needs.  Expected Discharge Plan: Home w Home Health Services Barriers to Discharge: Continued Medical Work up   Patient Goals and CMS Choice Patient states their goals for this hospitalization and ongoing recovery are:: home          Expected Discharge Plan and Services In-house Referral: Clinical Social Work Discharge Planning Services: CM Consult Post Acute Care Choice: Home Health                                        Prior Living Arrangements/Services   Lives with:: Spouse Patient language and need for interpreter reviewed:: Yes Do you feel safe going back to the place where you live?: Yes      Need for Family  Participation in Patient Care: Yes (Comment) Care giver support system in place?: Yes (comment) Current home services:  (n/a) Criminal Activity/Legal Involvement Pertinent to Current Situation/Hospitalization: No - Comment as needed  Activities of Daily Living Home Assistive Devices/Equipment: Eyeglasses ADL Screening (condition at time of admission) Patient's cognitive ability adequate to safely complete daily activities?: Yes Is the patient deaf or have difficulty hearing?: No Does the patient have difficulty seeing, even when wearing glasses/contacts?: No Does the patient have difficulty concentrating, remembering, or making decisions?: No Patient able to express need for assistance with ADLs?: Yes Does the patient have difficulty dressing or bathing?: No Independently performs ADLs?: Yes (appropriate for developmental age) Does the patient have difficulty walking or climbing stairs?: No Weakness of Legs: None Weakness of Arms/Hands: None  Permission Sought/Granted Permission sought to share information with : Case Manager Permission granted to share information with : Yes, Verbal Permission Granted  Share Information with NAME: Burnard Bunting, RN, CM     Permission granted to share info w Relationship: Alois Whittiker (spouse) [phone]     Emotional Assessment Appearance:: Appears stated age Attitude/Demeanor/Rapport: Gracious Affect (typically observed): Accepting Orientation: : Oriented to Self, Oriented to Place, Oriented to  Time, Oriented to Situation Alcohol / Substance Use: Not Applicable Psych Involvement: No (comment)  Admission diagnosis:  Perforated diverticulum of large  intestine [K57.20] Perforated sigmoid colon (HCC) [K63.1] Perforation of sigmoid colon due to diverticulitis [K57.20] Patient Active Problem List   Diagnosis Date Noted   Perforation of sigmoid colon due to diverticulitis 09/10/2022   Depressive disorder 09/09/2022   Pleural nodule  09/09/2022   sespsis secondary to perorated rectosigmoid colon 09/09/2022   Sepsis (HCC) 09/09/2022   GERD (gastroesophageal reflux disease) 09/09/2022   Attention deficit hyperactivity disorder (ADHD) 03/24/2016   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 [place] [address] Phone: [phone] Fax: [phone]     Social Determinants of Health (SDOH) Social History: SDOH Screenings   Food Insecurity: No Food Insecurity (09/15/2022)  Housing: Low Risk  (09/15/2022)  Transportation Needs: No Transportation Needs (09/15/2022)  Utilities: Not At Risk (09/15/2022)  Tobacco Use: Low Risk  (09/11/2022)   SDOH Interventions: Food Insecurity Interventions: [id number] Referral Housing Interventions: Inpatient TOC Transportation Interventions: Inpatient TOC Utilities Interventions: Inpatient TOC   Readmission Risk Interventions     No data to display

## 2022-09-15 NOTE — Progress Notes (Signed)
Dressing change done with patients wife at the bedside. Patients wife expressed understanding of how to change dressing and all questions were answered.

## 2022-09-16 ENCOUNTER — Encounter (HOSPITAL_BASED_OUTPATIENT_CLINIC_OR_DEPARTMENT_OTHER): Payer: Self-pay | Admitting: Orthopaedic Surgery

## 2022-09-16 LAB — BASIC METABOLIC PANEL
Anion gap: 7 (ref 5–15)
BUN: 9 mg/dL (ref 6–20)
CO2: 21 mmol/L — ABNORMAL LOW (ref 22–32)
Calcium: 8.4 mg/dL — ABNORMAL LOW (ref 8.9–10.3)
Chloride: 108 mmol/L (ref 98–111)
Creatinine, Ser: 0.84 mg/dL (ref 0.61–1.24)
GFR, Estimated: >60 mL/min (ref >60)
Glucose, Bld: 96 mg/dL (ref 70–99)
Potassium: 3.6 mmol/L (ref 3.5–5.1)
Sodium: 136 mmol/L (ref 135–145)

## 2022-09-16 MED ORDER — ACETAMINOPHEN 500 MG PO TABS: 1000.0000 mg | ORAL_TABLET | Freq: Four times a day (QID) | ORAL | 0 refills | Status: AC | PRN

## 2022-09-16 MED ORDER — OXYCODONE HCL 5 MG PO TABS: 5.0000 mg | ORAL_TABLET | Freq: Four times a day (QID) | ORAL | 0 refills | Status: DC | PRN

## 2022-09-16 MED ORDER — IBUPROFEN 200 MG PO TABS: 600.0000 mg | ORAL_TABLET | Freq: Three times a day (TID) | ORAL | 2 refills | Status: AC | PRN

## 2022-09-16 MED ORDER — METHOCARBAMOL 500 MG PO TABS: 500.0000 mg | ORAL_TABLET | Freq: Three times a day (TID) | ORAL | 0 refills | Status: DC | PRN

## 2022-09-16 NOTE — Progress Notes (Signed)
Discharge instructions discussed with patient and family, verbalized agreement and understanding, including ostomy and wound care.

## 2022-09-16 NOTE — Consult Note (Incomplete)
WOC Nurse ostomy follow up Stoma type/location: LLQ, end colostomy  Stomal assessment/size:  Peristomal assessment:  Treatment options for stomal/peristomal skin:  Output  Ostomy pouching: 2pc.  Education provided:  Enrolled patient in [employer] Discharge program: Yes

## 2022-09-16 NOTE — Discharge Summary (Signed)
Patient ID: George Cooley [id number] 1980/11/05 42 y.o.  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 male with medical history significant of ADHD, hypothyroidism, HLD, depression, hypo testosterone, pituitary microadenoma who presented to ED with complaints of acute abdominal pain shortly after placing an enema around 130 this afternoon. He states he has been doing this x15-20 years, once to twice a day. He has a hose connected to his shower and a silicone tube that goes into his rectum.  He states after gave himself his enema he had a sharp and excruciating pain in his LLQ and then cramping of his abdominal muscles. His wife then massaged his stomach and this seemed to help as well as laying down; however, the pain continued to progress and became severe so came to Ed.  Pain rated as a 10/10, described as sharp. It radiated to the entire abdomen. He has had a lot of nausea and only one episode of vomiting. No fevers at home. His last normal BM was yesterday evening. He has not had any flatus. He has had some liquid (mainly water) come out. He has had a lot of burping.    He has no history of diverticulitis/colonoscopy.      He has been feeling good. Denies any fever/chills, vision  changes/headaches, chest pain or palpitations, shortness of breath or cough, dysuria or leg swelling.   Procedures Hartmann's procedure for perforation of colon, Dr. Luisa Hart 09/10/22  Hospital Course:  The patient was admitted and started on IV abx therapy and bowel rest.  Unfortunately, his symptoms progressed and he required the above procedure.  He tolerated this well and had an NGT placed.  After 3 days, his colostomy starting working.  His NGT was removed and his diet was advanced as tolerated.  His midline wound was open and packed with a NS WD dressing BID.  He mobilized and had good pain control.  He completed a total of 5 days of post operative abx therapy.  No further abx were warranted at discharge.  His JP remained serous and was removed prior to discharge.  On POD 6, he was stable for DC home.  Physical Exam: Abd: soft, appropriately tender, colostomy with stool and air present, +BS, midline wound is clean and packed, JP with serous fluid.  Removed by RN with no issues.  Allergies as of 09/16/2022   No Known Allergies      Medication List     TAKE these medications    acetaminophen 500 MG tablet Commonly known as: TYLENOL Take 2 tablets (1,000 mg total) by mouth every 6 (six) hours as needed.   atorvastatin 10 MG tablet Commonly known as: LIPITOR Take 10 mg by mouth every evening.   buPROPion 150 MG 24 hr tablet Commonly known as: WELLBUTRIN XL Take 150 mg by mouth daily.   cabergoline 0.5  MG tablet Commonly known as: DOSTINEX Take 0.25 mg by mouth 2 (two) times a week.   famotidine 20 MG tablet Commonly known as: PEPCID Take 20 mg by mouth 2 (two) times daily.   FIBER PO Take 1 tablet by mouth at bedtime.   ibuprofen 200 MG tablet Commonly known as: Motrin IB Take 3 tablets (600 mg total) by mouth every 8 (eight) hours as needed.   lamoTRIgine 100 MG tablet Commonly known as: LAMICTAL Take 100 mg by mouth every evening.   levothyroxine 88 MCG  tablet Commonly known as: SYNTHROID Take 88 mcg by mouth daily before breakfast.   Magnesium 250 MG Tabs Take 1 tablet by mouth at bedtime.   methocarbamol 500 MG tablet Commonly known as: ROBAXIN Take 1 tablet (500 mg total) by mouth every 8 (eight) hours as needed for muscle spasms.   multivitamin with minerals tablet Take 1 tablet by mouth daily.   oxyCODONE 5 MG immediate release tablet Commonly known as: Oxy IR/ROXICODONE Take 1-2 tablets (5-10 mg total) by mouth every 6 (six) hours as needed for moderate pain.   PRESCRIPTION MEDICATION Take 15.5 mg by mouth daily. Adzneyx-ADHD medication   PROBIOTIC PO Take 1 tablet by mouth at bedtime.   sertraline 100 MG tablet Commonly known as: ZOLOFT Take 200 mg by mouth at bedtime.   testosterone cypionate 200 MG/ML injection Commonly known as: DEPOTESTOSTERONE CYPIONATE Inject 200 mg into the muscle every 14 (fourteen) days.   Vitamin D3 125 MCG (5000 UT) Caps Take 1 capsule by mouth at bedtime.          Follow-up Information     Harriette Bouillon, MD Follow up on 10/07/2022.   Specialty: General Surgery Why: 9:00am, Arrive 30 minutes prior to your appointment time, Please bring your insurance card and photo ID Contact information: [address] [place] [address] [phone]         [facility] Follow up.   Specialty: [facility] Contact information: [address] [place] [address] [phone]         Nonnie Done., MD Follow up in 3 month(s).   Specialty: Family Medicine Why: Lung nodule noted on admission CT scan.  need to follow up for repeat imaging in 3 months Contact information: [address] [place] [address] [phone]                 Signed: Barnetta Chapel, [facility] 09/16/2022, 10:15 AM Please see Amion for pager number during day hours 7:00am-4:30pm, 7-11:30am on Weekends

## 2022-09-23 ENCOUNTER — Ambulatory Visit (HOSPITAL_COMMUNITY)
Admission: RE | Admit: 2022-09-23 | Discharge: 2022-09-23 | Disposition: A | Discharge status: Home / Self Care | Payer: No Typology Code available for payment source | Source: Ambulatory Visit | Attending: Nurse Practitioner | Admitting: Nurse Practitioner

## 2022-09-23 DIAGNOSIS — K9400 Colostomy complication, unspecified: Secondary | ICD-10-CM

## 2022-09-23 DIAGNOSIS — K219 Gastro-esophageal reflux disease without esophagitis: Secondary | ICD-10-CM | POA: Insufficient documentation

## 2022-09-23 DIAGNOSIS — Z79899 Other long term (current) drug therapy: Secondary | ICD-10-CM | POA: Diagnosis not present

## 2022-09-23 DIAGNOSIS — Z433 Encounter for attention to colostomy: Secondary | ICD-10-CM | POA: Insufficient documentation

## 2022-09-23 DIAGNOSIS — Z79890 Hormone replacement therapy (postmenopausal): Secondary | ICD-10-CM | POA: Insufficient documentation

## 2022-09-23 DIAGNOSIS — T8189XD Other complications of procedures, not elsewhere classified, subsequent encounter: Secondary | ICD-10-CM

## 2022-09-23 NOTE — Progress Notes (Signed)
[facility]   Reason for visit:  LLQ end colostomy HPI:  Perforated sigmoid colon Exploratory laparotomy with colostomy Past Medical History:  Diagnosis Date   GERD (gastroesophageal reflux disease)    No family history on file. Not on File Current Outpatient Medications  Medication Sig Dispense Refill Last Dose   acetaminophen (TYLENOL) 500 MG tablet Take 2 tablets (1,000 mg total) by mouth every 6 (six) hours as needed. 30 tablet 0    atorvastatin (LIPITOR) 10 MG tablet Take 10 mg by mouth every evening.      buPROPion (WELLBUTRIN XL) 150 MG 24 hr tablet Take 150 mg by mouth daily.      cabergoline (DOSTINEX) 0.5 MG tablet Take 0.25 mg by mouth 2 (two) times a week.      Cholecalciferol (VITAMIN D3) 125 MCG (5000 UT) CAPS Take 1 capsule by mouth at bedtime.      esomeprazole (NEXIUM) 20 MG capsule Take 20 mg by mouth daily before breakfast.      famotidine (PEPCID) 20 MG tablet Take 20 mg by mouth 2 (two) times daily.      fexofenadine (ALLEGRA) 180 MG tablet Take 180 mg by mouth daily with breakfast.      FIBER PO Take 1 tablet by mouth at bedtime.      FIBER SELECT GUMMIES PO Take 2 tablets by mouth daily. 5g/per gummies      ibuprofen (ADVIL) 200 MG tablet Take 600 mg by mouth every 6 (six) hours as needed (for pain.).      ibuprofen (MOTRIN IB) 200 MG tablet Take 3 tablets (600 mg total) by mouth every 8 (eight) hours as needed. 100 tablet 2    lamoTRIgine (LAMICTAL) 100 MG tablet Take 100 mg by mouth every evening.      levothyroxine (SYNTHROID) 88 MCG tablet Take 88 mcg by mouth daily before breakfast.      Magnesium 250 MG TABS Take 1 tablet by mouth at bedtime.      methocarbamol (ROBAXIN) 500 MG tablet Take 1 tablet (500 mg total) by mouth every 8 (eight) hours as needed for muscle spasms. 60 tablet 0    Multiple Vitamin (MULTIVITAMIN WITH MINERALS) TABS tablet Take 1 tablet by mouth daily. One A Day Men's 50+      Multiple Vitamins-Minerals (MULTIVITAMIN WITH  MINERALS) tablet Take 1 tablet by mouth daily.      Omega-3 Fatty Acids (FISH OIL) 1200 MG CAPS Take 1,200 mg by mouth daily.      oxyCODONE (OXY IR/ROXICODONE) 5 MG immediate release tablet Take 1-2 tablets (5-10 mg total) by mouth every 6 (six) hours as needed for moderate pain. 25 tablet 0    PRESCRIPTION MEDICATION Take 15.5 mg by mouth daily. Adzneyx-ADHD medication      Probiotic Product (PROBIOTIC PO) Take 1 capsule by mouth daily.      Probiotic Product (PROBIOTIC PO) Take 1 tablet by mouth at bedtime.      sertraline (ZOLOFT) 100 MG tablet Take 200 mg by mouth at bedtime.      testosterone cypionate (DEPOTESTOSTERONE CYPIONATE) 200 MG/ML injection Inject 200 mg into the muscle every 14 (fourteen) days.      No current facility-administered medications for this encounter.   ROS  Review of Systems  Gastrointestinal:        LLQ colostomy  Skin:  Positive for wound.       Midline abdominal wound  Psychiatric/Behavioral: Negative.    All other systems reviewed and are  negative.  Vital signs:  BP 113/81 (BP Location: Right Arm)   Pulse 84   Temp 97.7 F (36.5 C) (Oral)   Resp 20   SpO2 96%  Exam:  Physical Exam Vitals reviewed.  Constitutional:      Appearance: Normal appearance.  Abdominal:     Palpations: Abdomen is soft.     Comments: Surgical wound, midline  Skin:    General: Skin is warm and dry.     Findings: Lesion and rash present.  Neurological:     General: No focal deficit present.     Mental Status: He is alert and oriented to person, place, and time.     Stoma type/location:  LLQ colostomy Stomal assessment/size:  1 3/8" slightly oval and budded Peristomal assessment:  intact  midline incision Treatment options for stomal/peristomal skin: barrier ring and 2 piece pouch.   Output: soft brown stool Ostomy pouching: 2pc. 2 3/4 " pouch with barrier ring  Education provided:  performed pouch change and wound care.  Wife present and has been assisting at  home     Impression/dx  Colostomy Healing midline incision Discussion  See back as needed Plan  Continue pouching  HH assisting with teaching and supplies at this time.  Will need to be set up with supplier.     Visit time: 55 minutes.   Maple Hudson FNP-BC

## 2022-09-27 DIAGNOSIS — K9400 Colostomy complication, unspecified: Secondary | ICD-10-CM | POA: Insufficient documentation

## 2022-09-27 DIAGNOSIS — T8189XD Other complications of procedures, not elsewhere classified, subsequent encounter: Secondary | ICD-10-CM | POA: Insufficient documentation

## 2022-09-27 NOTE — Discharge Instructions (Signed)
Continue 2 piece pouch with barrier ring Will set up for supplies- to begin after [facility] discharges

## 2022-11-08 ENCOUNTER — Encounter: Payer: Self-pay | Admitting: Internal Medicine

## 2022-11-11 ENCOUNTER — Ambulatory Visit (INDEPENDENT_AMBULATORY_CARE_PROVIDER_SITE_OTHER): Payer: Commercial Managed Care - PPO | Admitting: Nurse Practitioner

## 2022-11-11 ENCOUNTER — Encounter: Payer: Self-pay | Admitting: Nurse Practitioner

## 2022-11-11 ENCOUNTER — Other Ambulatory Visit: Payer: No Typology Code available for payment source

## 2022-11-11 ENCOUNTER — Ambulatory Visit
Admission: RE | Admit: 2022-11-11 | Discharge: 2022-11-11 | Disposition: A | Discharge status: Home / Self Care | Payer: Commercial Managed Care - PPO | Source: Ambulatory Visit | Attending: Internal Medicine | Admitting: Internal Medicine

## 2022-11-11 VITALS — BP 102/72 | HR 92 | Ht 75.0 in | Wt 214.2 lb

## 2022-11-11 DIAGNOSIS — Z933 Colostomy status: Secondary | ICD-10-CM

## 2022-11-11 DIAGNOSIS — K631 Perforation of intestine (nontraumatic): Secondary | ICD-10-CM | POA: Diagnosis not present

## 2022-11-11 DIAGNOSIS — R9389 Abnormal findings on diagnostic imaging of other specified body structures: Secondary | ICD-10-CM

## 2022-11-11 DIAGNOSIS — E221 Hyperprolactinemia: Secondary | ICD-10-CM

## 2022-11-11 MED ORDER — NA SULFATE-K SULFATE-MG SULF 17.5-3.13-1.6 GM/177ML PO SOLN
1.0000 | Freq: Once | ORAL | 0 refills | Status: AC
Start: 2022-11-11 — End: 2022-11-11

## 2022-11-11 MED ORDER — GADOPICLENOL 0.5 MMOL/ML IV SOLN
9.0000 mL | Freq: Once | INTRAVENOUS | Status: AC | PRN
Start: 2022-11-11 — End: 2022-11-11
  Administered 2022-11-11: 9 mL via INTRAVENOUS

## 2022-11-11 NOTE — Progress Notes (Signed)
Primary GI: - George Burn, MD   Assessment / Plan   42 y.o. yo male with a past medical history consisting of, but not necessarily limited to ADHD, depression, GERD, pituitary microadenoma, hypothyroidism, colon perforation status post sigmoid colectomy and end colostomy.  Patient is referred by General Surgery (Dr. Luisa Hart) for a colostomy prior to ostomy takedown  Perforated rectosigmoid colon with peritonitis s/p exploratory laparotomy with sigmoid colectomy and end colostomy /Hartmann's procedure on 4/27.  Perforation presumably secondary to chronic enema use. No history of diverticulitis though imaging did show wall thickening of the rectosigmoid colon.  -Will schedule for colonoscopy to be done prior to ostomy reversal. The risks and benefits of colonoscopy with possible polypectomy / biopsies were discussed and the patient agrees to proceed.   Chronic GERD, asymptomatic on famotidine 20 mg daily 2 years ago he was changed from a PPI to famotidine and has done well  ADHD, on Adzneyx  Microadenoma of pituitary gland  History of Present Illness   Chief Complaint: Needs a colonoscopy to reverse his colostomy  Patient was hospitalized late April 2024 SIRS/ sepsis secondary to perforated rectosigmoid colon with peritonitis. Perforation felt to be due to chronic enema use.  He had no history of diverticulitis though there was diffuse wall thickening of the rectosigmoid on CT scan ( report below).  He underwent an exploratory laparotomy with sigmoid colectomy and end colostomy/Hartmann's procedure on 4/27.   Levaughn has not had any blood in his ostomy.  No abdominal pain.  No fevers.  He did recently have diarrhea but that resolved with Pepto-Bismol.  He has no family history of colon cancer.   Previous Endoscopies / Labs /  Imaging      Latest Ref Rng & Units 09/14/2022    5:06 AM 09/13/2022    5:02 AM 09/11/2022    2:56 AM  CBC  WBC 4.0 - 10.5 K/uL 9.3  9.9  17.5   Hemoglobin 13.0  - 17.0 g/dL 40.9  81.1  91.4   Hematocrit 39.0 - 52.0 % 43.8  38.2  41.8   Platelets 150 - 400 K/uL 193  234  160     Lab Results  Component Value Date   LIPASE 36 09/09/2022      Latest Ref Rng & Units 09/16/2022    5:10 AM 09/14/2022    5:06 AM 09/13/2022    5:02 AM  CMP  Glucose 70 - 99 mg/dL 96  782  78   BUN 6 - 20 mg/dL 9  10  16    Creatinine 0.61 - 1.24 mg/dL 9.56  2.13  0.86   Sodium 135 - 145 mmol/L 136  132  131   Potassium 3.5 - 5.1 mmol/L 3.6  3.3  3.3   Chloride 98 - 111 mmol/L 108  101  96   CO2 22 - 32 mmol/L 21  20  22    Calcium 8.9 - 10.3 mg/dL 8.4  8.0  7.7      CT ABDOMEN PELVIS W CONTRAST CLINICAL DATA:  Abdominal pain, bowel perforation on previous exam  EXAM: CT ABDOMEN AND PELVIS WITH CONTRAST  TECHNIQUE: Multidetector CT imaging of the abdomen and pelvis was performed using the standard protocol following bolus administration of intravenous contrast.  RADIATION DOSE REDUCTION: This exam was performed according to the departmental dose-optimization program which includes automated exposure control, adjustment of the mA and/or kV according to patient size and/or use of iterative reconstruction technique.  CONTRAST:   OMNIPAQUE IOHEXOL 300 MG/ML  SOLN  COMPARISON:  09/09/2022  FINDINGS: Lower chest: Hypoventilatory changes are seen at the lung bases. No effusion or pneumothorax.  Hepatobiliary: Vicarious excretion of contrast within the gallbladder. No cholelithiasis or cholecystitis. The liver is stable.  Pancreas: Unremarkable. No pancreatic ductal dilatation or surrounding inflammatory changes.  Spleen: Normal in size without focal abnormality.  Adrenals/Urinary Tract: Excreted contrast within the bladder from previous CT. No filling defects. Kidneys enhance normally. The adrenals are unremarkable.  Stomach/Bowel: Since the prior exam, there is progressive wall thickening of the rectosigmoid colon. Increased free fluid and  free gas is seen adjacent to the thickened sigmoid colon within the lower pelvis, reference image 87/2, consistent with perforated sigmoid colon. No drainable fluid collection or abscess.  No bowel obstruction or ileus.  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 There are no significant vascular findings. No pathologic adenopathy.  Reproductive: Prostate is unremarkable.  Other: There is increasing free fluid within the lower pelvis, without evidence of abscess. There is marked increased extraluminal gas within the lower pelvis and throughout the peritoneal cavity, consistent with perforated sigmoid colon.  No abdominal wall hernia.  Musculoskeletal: No acute or destructive bony lesions. Reconstructed images demonstrate no additional findings.  IMPRESSION: 1. Perforated sigmoid diverticulitis or colitis is again noted, with increased free fluid and free gas since prior exam. Progressive colonic wall thickening. No drainable fluid collection or abscess at this time. 2. Worsening bibasilar hypoventilatory changes.  Multiple attempts were made to contact the ordering or covering physician, without success.  Therefore these results will be called to the ordering clinician or representative by the Radiologist Assistant, and communication documented in the PACS or [employer].  Electronically Signed   By: Sharlet Salina M.D.   On: 09/10/2022 15:19    Past Medical History:  Diagnosis Date   GERD (gastroesophageal reflux disease)    Past Surgical History:  Procedure Laterality Date   LAPAROTOMY N/A 09/10/2022   Procedure: EXPLORATORY LAPAROTOMY WITH COLOSTOMY;  Surgeon: Harriette Bouillon, MD;  Location: [facility];  Service: General;  Laterality: N/A;   ORIF ANKLE FRACTURE Right 04/30/2020   Procedure: OPEN TREATMENT OF RIGHT TRIMALLEOLAR FRACTURE POSSIBLE SYNDESMOSIS;  Surgeon: Terance Hart, MD;  Location: [facility];   Service: Orthopedics;  Laterality: Right;  LENGTH OF SURGERY 1.5 HOURS   tendon on toe repair     No family history on file. Social History   Tobacco Use   Smoking status: Never   Smokeless tobacco: Never  Vaping Use   Vaping Use: Never used  Substance Use Topics   Alcohol use: Yes    Alcohol/week: 1.0 standard drink of alcohol    Types: 1 Glasses of wine per week    Comment: occational   Drug use: Never   Current Outpatient Medications  Medication Sig Dispense Refill   acetaminophen (TYLENOL) 500 MG tablet Take 2 tablets (1,000 mg total) by mouth every 6 (six) hours as needed. 30 tablet 0   atorvastatin (LIPITOR) 10 MG tablet Take 10 mg by mouth every evening.     buPROPion (WELLBUTRIN XL) 150 MG 24 hr tablet Take 150 mg by mouth daily.     cabergoline (DOSTINEX) 0.5 MG tablet Take 0.25 mg by mouth 2 (two) times a week.     Cholecalciferol (VITAMIN D3) 125 MCG (5000 UT) CAPS Take 1 capsule by mouth at bedtime.     esomeprazole (NEXIUM) 20 MG capsule Take 20 mg by  mouth daily before breakfast.     famotidine (PEPCID) 20 MG tablet Take 20 mg by mouth 2 (two) times daily.     fexofenadine (ALLEGRA) 180 MG tablet Take 180 mg by mouth daily with breakfast.     FIBER PO Take 1 tablet by mouth at bedtime.     FIBER SELECT GUMMIES PO Take 2 tablets by mouth daily. 5g/per gummies     ibuprofen (ADVIL) 200 MG tablet Take 600 mg by mouth every 6 (six) hours as needed (for pain.).     ibuprofen (MOTRIN IB) 200 MG tablet Take 3 tablets (600 mg total) by mouth every 8 (eight) hours as needed. 100 tablet 2   lamoTRIgine (LAMICTAL) 100 MG tablet Take 100 mg by mouth every evening.     levothyroxine (SYNTHROID) 88 MCG tablet Take 88 mcg by mouth daily before breakfast.     Magnesium 250 MG TABS Take 1 tablet by mouth at bedtime.     methocarbamol (ROBAXIN) 500 MG tablet Take 1 tablet (500 mg total) by mouth every 8 (eight) hours as needed for muscle spasms. 60 tablet 0   Multiple Vitamin  (MULTIVITAMIN WITH MINERALS) TABS tablet Take 1 tablet by mouth daily. One A Day Men's 50+     Multiple Vitamins-Minerals (MULTIVITAMIN WITH MINERALS) tablet Take 1 tablet by mouth daily.     Omega-3 Fatty Acids (FISH OIL) 1200 MG CAPS Take 1,200 mg by mouth daily.     oxyCODONE (OXY IR/ROXICODONE) 5 MG immediate release tablet Take 1-2 tablets (5-10 mg total) by mouth every 6 (six) hours as needed for moderate pain. 25 tablet 0   PRESCRIPTION MEDICATION Take 15.5 mg by mouth daily. Adzneyx-ADHD medication     Probiotic Product (PROBIOTIC PO) Take 1 capsule by mouth daily.     Probiotic Product (PROBIOTIC PO) Take 1 tablet by mouth at bedtime.     sertraline (ZOLOFT) 100 MG tablet Take 200 mg by mouth at bedtime.     testosterone cypionate (DEPOTESTOSTERONE CYPIONATE) 200 MG/ML injection Inject 200 mg into the muscle every 14 (fourteen) days.     No current facility-administered medications for this visit.   Not on File   Review of Systems: All systems reviewed and negative except where noted in HPI.   Wt Readings from Last 3 Encounters:  09/11/22 215 lb (97.5 kg)  04/30/20 224 lb 3.3 oz (101.7 kg)    Physical Exam:  BP 102/72   Pulse 92   Ht 6\' 3"  (1.905 m)   Wt 214 lb 4 oz (97.2 kg)   SpO2 97%   BMI 26.78 kg/m  Constitutional:  Pleasant, generally well appearing male in no acute distress.  Here with wife and toddler Psychiatric:  Normal mood and affect. Behavior is normal. EENT: Pupils normal.  Conjunctivae are normal. No scleral icterus. Neck supple.  Cardiovascular: Normal rate, regular rhythm.  Pulmonary/chest: Effort normal and breath sounds normal. No wheezing, rales or rhonchi. Abdominal: Soft, nondistended, nontender. Bowel sounds active throughout. There are no masses palpable. No hepatomegaly. Neurological: Alert and oriented to person place and time.  Willette Cluster, NP  11/11/2022, 7:23 AM  Cc:  Referring Provider Harriette Bouillon, MD

## 2022-11-11 NOTE — Patient Instructions (Addendum)
  You have been scheduled for a colonoscopy. Please follow written instructions given to you at your visit today.  Please pick up your prep supplies at the pharmacy within the next 1-3 days. If you use inhalers (even only as needed), please bring them with you on the day of your procedure.  _______________________________________________________  If your blood pressure at your visit was 140/90 or greater, please contact your primary care physician to follow up on this.  _______________________________________________________  If you are age 43 or older, your body mass index should be between 23-30. Your Body mass index is 26.78 kg/m. If this is out of the aforementioned range listed, please consider follow up with your Primary Care Provider.  If you are age 36 or younger, your body mass index should be between 19-25. Your Body mass index is 26.78 kg/m. If this is out of the aformentioned range listed, please consider follow up with your Primary Care Provider.   ________________________________________________________  The [facility] providers would like to encourage you to use [facility] to communicate with providers for non-urgent requests or questions.  Due to long hold times on the telephone, sending your provider a message by [facility] may be a faster and more efficient way to get a response.  Please allow 48 business hours for a response.  Please remember that this is for non-urgent requests.  _______________________________________________________  It was a pleasure to see you today!  Thank you for trusting me with your gastrointestinal care!

## 2022-11-11 NOTE — Progress Notes (Signed)
I agree with the assessment and plan as outlined by Ms. Guenther. 

## 2022-11-14 ENCOUNTER — Other Ambulatory Visit: Payer: No Typology Code available for payment source

## 2022-11-28 ENCOUNTER — Telehealth: Payer: Self-pay | Admitting: Nurse Practitioner

## 2022-11-28 NOTE — Telephone Encounter (Signed)
Inbound call from patient requesting for prep instructions for 7/18 colonoscopy be sent through mychart. States he misplaced instruction. Please advise, thank you.

## 2022-12-01 ENCOUNTER — Encounter: Payer: Self-pay | Admitting: Internal Medicine

## 2022-12-01 ENCOUNTER — Ambulatory Visit (AMBULATORY_SURGERY_CENTER): Payer: Commercial Managed Care - PPO | Admitting: Internal Medicine

## 2022-12-01 VITALS — BP 100/68 | HR 91 | Temp 98.9°F | Resp 10 | Ht 75.0 in | Wt 214.0 lb

## 2022-12-01 DIAGNOSIS — D123 Benign neoplasm of transverse colon: Secondary | ICD-10-CM

## 2022-12-01 DIAGNOSIS — R9389 Abnormal findings on diagnostic imaging of other specified body structures: Secondary | ICD-10-CM | POA: Diagnosis not present

## 2022-12-01 DIAGNOSIS — D124 Benign neoplasm of descending colon: Secondary | ICD-10-CM

## 2022-12-01 DIAGNOSIS — K5140 Inflammatory polyps of colon without complications: Secondary | ICD-10-CM

## 2022-12-01 MED ORDER — SODIUM CHLORIDE 0.9 % IV SOLN
500.0000 mL | INTRAVENOUS | Status: DC
Start: 2022-12-01 — End: 2022-12-01

## 2022-12-01 NOTE — Progress Notes (Signed)
Called to room to assist during endoscopic procedure.  Patient ID and intended procedure confirmed with present staff. Received instructions for my participation in the procedure from the performing physician.  

## 2022-12-01 NOTE — Progress Notes (Signed)
Pt's states no medical or surgical changes since previsit or office visit. 

## 2022-12-01 NOTE — Progress Notes (Signed)
GASTROENTEROLOGY PROCEDURE H&P NOTE   Primary Care Physician: Nonnie Done., MD    Reason for Procedure:   History of perforated rectosigmoid colon s/p sigmoid colectomy and end colostomy  Plan:    Colonoscopy  Patient is appropriate for endoscopic procedure(s) in the ambulatory ([facility]) setting.  The nature of the procedure, as well as the risks, benefits, and alternatives were carefully and thoroughly reviewed with the patient. Ample time for discussion and questions allowed. The patient understood, was satisfied, and agreed to proceed.     HPI: Emmitte Surgeon is a 42 y.o. male who presents for colonoscopy for evaluation of history of perforated rectosigmoid colon s/p sigmoid colectomy and end colostomy.  Patient was most recently seen in the Gastroenterology Clinic on 11/11/22.  No interval change in medical history since that appointment. Please refer to that note for full details regarding GI history and clinical presentation.   Past Medical History:  Diagnosis Date   ADHD    Anxiety    Depression    GERD (gastroesophageal reflux disease)    Hypothyroidism     Past Surgical History:  Procedure Laterality Date   LAPAROTOMY N/A 09/10/2022   Procedure: EXPLORATORY LAPAROTOMY WITH COLOSTOMY;  Surgeon: Harriette Bouillon, MD;  Location: [facility];  Service: General;  Laterality: N/A;   ORIF ANKLE FRACTURE Right 04/30/2020   Procedure: OPEN TREATMENT OF RIGHT TRIMALLEOLAR FRACTURE POSSIBLE SYNDESMOSIS;  Surgeon: Terance Hart, MD;  Location: [facility];  Service: Orthopedics;  Laterality: Right;  LENGTH OF SURGERY 1.5 HOURS   tendon on toe repair      Prior to Admission medications   Medication Sig Start Date End Date Taking? Authorizing Provider  acetaminophen (TYLENOL) 500 MG tablet Take 2 tablets (1,000 mg total) by mouth every 6 (six) hours as needed. 09/16/22   Barnetta Chapel, PA-C  Amphetamine ER (ADZENYS XR-ODT) 15.7 MG TBED Take 1 tablet by mouth  daily. For ADHD    [provider]  atorvastatin (LIPITOR) 10 MG tablet Take 10 mg by mouth every evening.    [provider]  buPROPion (WELLBUTRIN XL) 150 MG 24 hr tablet Take 150 mg by mouth daily.    [provider]  cabergoline (DOSTINEX) 0.5 MG tablet Take 0.25 mg by mouth 2 (two) times a week.    [provider]  Cholecalciferol (VITAMIN D3) 125 MCG (5000 UT) CAPS Take 1 capsule by mouth at bedtime.    [provider]  famotidine (PEPCID) 20 MG tablet Take 20 mg by mouth 2 (two) times daily.    [provider]  fexofenadine (ALLEGRA) 180 MG tablet Take 180 mg by mouth daily with breakfast. Patient not taking: Reported on 11/11/2022    [provider]  FIBER SELECT GUMMIES PO Take 2 tablets by mouth daily. 5g/per gummies    [provider]  ibuprofen (MOTRIN IB) 200 MG tablet Take 3 tablets (600 mg total) by mouth every 8 (eight) hours as needed. 09/16/22 09/16/23  Barnetta Chapel, PA-C  lamoTRIgine (LAMICTAL) 100 MG tablet Take 150 mg by mouth every evening.    [provider]  levothyroxine (SYNTHROID) 88 MCG tablet Take 88 mcg by mouth daily before breakfast. 04/05/22   [provider]  Multiple Vitamins-Minerals (MULTIVITAMIN WITH MINERALS) tablet Take 1 tablet by mouth daily.    [provider]  Omega-3 Fatty Acids (FISH OIL) 1200 MG CAPS Take 1,200 mg by mouth daily. Patient not taking: Reported on 11/11/2022  [provider]  Probiotic Product (PROBIOTIC PO) Take 1 tablet by mouth at bedtime.    [provider]  sertraline (ZOLOFT) 100 MG tablet Take 100 mg by mouth at bedtime. 08/18/22   [provider]  testosterone cypionate (DEPOTESTOSTERONE CYPIONATE) 200 MG/ML injection Inject 200 mg into the muscle every 14 (fourteen) days. 03/17/22   [provider]    Current Outpatient Medications  Medication Sig Dispense Refill   acetaminophen (TYLENOL) 500 MG  tablet Take 2 tablets (1,000 mg total) by mouth every 6 (six) hours as needed. 30 tablet 0   Amphetamine ER (ADZENYS XR-ODT) 15.7 MG TBED Take 1 tablet by mouth daily. For ADHD     atorvastatin (LIPITOR) 10 MG tablet Take 10 mg by mouth every evening.     buPROPion (WELLBUTRIN XL) 150 MG 24 hr tablet Take 150 mg by mouth daily.     cabergoline (DOSTINEX) 0.5 MG tablet Take 0.25 mg by mouth 2 (two) times a week.     Cholecalciferol (VITAMIN D3) 125 MCG (5000 UT) CAPS Take 1 capsule by mouth at bedtime.     famotidine (PEPCID) 20 MG tablet Take 20 mg by mouth 2 (two) times daily.     fexofenadine (ALLEGRA) 180 MG tablet Take 180 mg by mouth daily with breakfast. (Patient not taking: Reported on 11/11/2022)     FIBER SELECT GUMMIES PO Take 2 tablets by mouth daily. 5g/per gummies     ibuprofen (MOTRIN IB) 200 MG tablet Take 3 tablets (600 mg total) by mouth every 8 (eight) hours as needed. 100 tablet 2   lamoTRIgine (LAMICTAL) 100 MG tablet Take 150 mg by mouth every evening.     levothyroxine (SYNTHROID) 88 MCG tablet Take 88 mcg by mouth daily before breakfast.     Multiple Vitamins-Minerals (MULTIVITAMIN WITH MINERALS) tablet Take 1 tablet by mouth daily.     Omega-3 Fatty Acids (FISH OIL) 1200 MG CAPS Take 1,200 mg by mouth daily. (Patient not taking: Reported on 11/11/2022)     Probiotic Product (PROBIOTIC PO) Take 1 tablet by mouth at bedtime.     sertraline (ZOLOFT) 100 MG tablet Take 100 mg by mouth at bedtime.     testosterone cypionate (DEPOTESTOSTERONE CYPIONATE) 200 MG/ML injection Inject 200 mg into the muscle every 14 (fourteen) days.     Current Facility-Administered Medications  Medication Dose Route Frequency Provider Last Rate Last Admin   0.9 %  sodium chloride infusion  500 mL Intravenous Continuous Imogene Burn, MD        Allergies as of 12/01/2022   (No Known Allergies)    Family History  Problem Relation Age of Onset   Colon cancer Neg Hx    Rectal cancer Neg Hx     Stomach cancer Neg Hx    Esophageal cancer Neg Hx    Liver cancer Neg Hx    Pancreatic cancer Neg Hx     Social History   Socioeconomic History   Marital status: Married    Spouse name: Not on file   Number of children: 1   Years of education: Not on file   Highest education level: Not on file  Occupational History   Occupation: Technical sales engineer  Tobacco Use   Smoking status: Never   Smokeless tobacco: Never  Vaping Use   Vaping status: Never Used  Substance and Sexual Activity   Alcohol use: Yes    Alcohol/week: 1.0 standard drink of alcohol    Types: 1 Glasses of wine  per week    Comment: occational   Drug use: Never   Sexual activity: Yes  Other Topics Concern   Not on file  Social History Narrative   ** Merged History Encounter **       Social Determinants of Health   Financial Resource Strain: Not on file  Food Insecurity: No Food Insecurity (09/15/2022)   Hunger Vital Sign    Worried About Running Out of Food in the Last Year: Never true    Ran Out of Food in the Last Year: Never true  Transportation Needs: No Transportation Needs (09/15/2022)   PRAPARE - Administrator, Civil Service (Medical): No    Lack of Transportation (Non-Medical): No  Physical Activity: Not on file  Stress: Not on file  Social Connections: Not on file  Intimate Partner Violence: Not At Risk (09/15/2022)   Humiliation, Afraid, Rape, and Kick questionnaire    Fear of Current or Ex-Partner: No    Emotionally Abused: No    Physically Abused: No    Sexually Abused: No    Physical Exam: Vital signs in last 24 hours: BP 112/68   Pulse 95   Temp 98.9 F (37.2 C)   Ht 6\' 3"  (1.905 m)   Wt 214 lb (97.1 kg)   SpO2 97%   BMI 26.75 kg/m  GEN: NAD EYE: Sclerae anicteric ENT: MMM CV: Non-tachycardic Pulm: No increased WOB GI: Soft NEURO:  Alert & Oriented   Eulah Pont, MD [facility]   12/01/2022 3:41 PM

## 2022-12-01 NOTE — Progress Notes (Signed)
Uneventful anesthetic. Report to pacu rn. Vss. Care resumed by rn. 

## 2022-12-01 NOTE — Op Note (Signed)
[facility] Patient Name: George Cooley Procedure Date: 12/01/2022 3:32 PM MRN: [id number] Endoscopist: Particia Lather , , [id number] Age: 42 Referring MD:  Date of Birth: July 22, 1980 Gender: Male Account #: [id number] Procedure:                Colonoscopy Indications:              Abnormal CT of the GI tract Medicines:                Monitored Anesthesia Care Procedure:                Pre-Anesthesia Assessment:                           - Prior to the procedure, a History and Physical                            was performed, and patient medications and                            allergies were reviewed. The patient's tolerance of                            previous anesthesia was also reviewed. The risks                            and benefits of the procedure and the sedation                            options and risks were discussed with the patient.                            All questions were answered, and informed consent                            was obtained. Prior Anticoagulants: The patient has                            taken no anticoagulant or antiplatelet agents. ASA                            Grade Assessment: II - A patient with mild systemic                            disease. After reviewing the risks and benefits,                            the patient was deemed in satisfactory condition to                            undergo the procedure.                           After obtaining informed consent, the colonoscope  was passed under direct vision. Throughout the                            procedure, the patient's blood pressure, pulse, and                            oxygen saturations were monitored continuously. The                            CF [id number] #[id number] was introduced through the                            sigmoid colostomy and advanced to the the terminal                            ileum. The colonoscopy was  performed without                            difficulty. The patient tolerated the procedure                            well. The quality of the bowel preparation was                            good. The terminal ileum, ileocecal valve,                            appendiceal orifice, and rectum were photographed.                            Rectal retroflexion was not performed due to                            narrowing rectum. Scope In: 3:52:55 PM Scope Out: 4:23:21 PM Scope Withdrawal Time: 0 hours 22 minutes 33 seconds  Total Procedure Duration: 0 hours 30 minutes 26 seconds  Findings:                 The terminal ileum appeared normal.                           Two sessile polyps were found in the descending                            colon and transverse colon. The polyps were 3 to 5                            mm in size. These polyps were removed with a cold                            snare. Resection and retrieval were complete.                           A moderate amount of solid stool was found in  the                            rectum, making visualization difficult. Complications:            No immediate complications. Estimated Blood Loss:     Estimated blood loss was minimal. Impression:               - The examined portion of the ileum was normal.                           - Two 3 to 5 mm polyps in the descending colon and                            in the transverse colon, removed with a cold snare.                            Resected and retrieved.                           - Stool in the rectum, interfering with                            visualization. Recommendation:           - Discharge patient to home (with escort).                           - Await pathology results.                           - Prep was poor in the rectal area, though large                            masses were able to ruled out.                           - Follow up with surgery regarding ostomy  reversal.                           - The findings and recommendations were discussed                            with the patient. Dr Particia Lather "Alan Ripper" Leonides Schanz,  12/01/2022 4:41:00 PM

## 2022-12-01 NOTE — Patient Instructions (Addendum)

## 2022-12-02 ENCOUNTER — Telehealth: Payer: Self-pay

## 2022-12-02 NOTE — Telephone Encounter (Signed)
  Follow up Call-     12/01/2022    3:28 PM  Call back number  Post procedure Call Back phone  # [phone]  Permission to leave phone message Yes     Patient questions:  Do you have a fever, pain , or abdominal swelling? No. Pain Score  0 *  Have you tolerated food without any problems? Yes.    Have you been able to return to your normal activities? Yes.    Do you have any questions about your discharge instructions: Diet   No. Medications  No. Follow up visit  No.  Do you have questions or concerns about your Care? No.  Actions: * If pain score is 4 or above: No action needed, pain <4.

## 2022-12-03 ENCOUNTER — Emergency Department (HOSPITAL_COMMUNITY): Admission: EM | Admit: 2022-12-03 | Payer: Commercial Managed Care - PPO | Source: Home / Self Care

## 2022-12-03 ENCOUNTER — Other Ambulatory Visit: Payer: Self-pay

## 2022-12-07 ENCOUNTER — Encounter: Payer: Self-pay | Admitting: Internal Medicine

## 2023-01-04 ENCOUNTER — Other Ambulatory Visit: Payer: Self-pay | Admitting: Surgery

## 2023-01-04 DIAGNOSIS — Z933 Colostomy status: Secondary | ICD-10-CM

## 2023-01-06 ENCOUNTER — Other Ambulatory Visit: Payer: Self-pay | Admitting: Urology

## 2023-01-12 ENCOUNTER — Ambulatory Visit
Admission: RE | Admit: 2023-01-12 | Discharge: 2023-01-12 | Disposition: A | Discharge status: Home / Self Care | Payer: Commercial Managed Care - PPO | Source: Ambulatory Visit | Attending: Surgery | Admitting: Surgery

## 2023-01-12 ENCOUNTER — Other Ambulatory Visit: Payer: Commercial Managed Care - PPO

## 2023-01-12 DIAGNOSIS — Z933 Colostomy status: Secondary | ICD-10-CM

## 2023-01-13 ENCOUNTER — Other Ambulatory Visit (HOSPITAL_COMMUNITY): Payer: Self-pay | Admitting: Nurse Practitioner

## 2023-01-13 DIAGNOSIS — Z433 Encounter for attention to colostomy: Secondary | ICD-10-CM

## 2023-02-17 NOTE — Progress Notes (Signed)
Sent message, via epic in basket, requesting orders in epic from surgeon.  

## 2023-02-19 ENCOUNTER — Ambulatory Visit: Payer: Self-pay | Admitting: Surgery

## 2023-02-23 NOTE — Patient Instructions (Addendum)
SURGICAL WAITING ROOM VISITATION Patients having surgery or a procedure may have no more than 2 support people in the waiting area - these visitors may rotate.    Children under the age of 81 must have an adult with them who is not the patient.  If the patient needs to stay at the hospital during part of their recovery, the visitor guidelines for inpatient rooms apply. Pre-op nurse will coordinate an appropriate time for 1 support person to accompany patient in pre-op.  This support person may not rotate.    Please refer to the [facility] website for the visitor guidelines for Inpatients (after your surgery is over and you are in a regular room).       Your procedure is scheduled on: 03-09-23   Report to [facility] Main Entrance    Report to admitting at 5:15 AM   Call this number if you have problems the morning of surgery [phone]   Do not eat food :After Midnight.   After Midnight you may have the following liquids until 4:30 AM DAY OF SURGERY  Water Non-Citrus Juices (without pulp, NO RED-Apple, White grape, White cranberry) Black Coffee (NO MILK/CREAM OR CREAMERS, sugar ok)  Clear Tea (NO MILK/CREAM OR CREAMERS, sugar ok) regular and decaf                             Plain Jell-O (NO RED)                                           Fruit ices (not with fruit pulp, NO RED)                                     Popsicles (NO RED)                                                               Sports drinks like Gatorade (NO RED)                         If you have questions, please contact your surgeon's office.   FOLLOW BOWEL PREP AND ANY ADDITIONAL PRE OP INSTRUCTIONS YOU RECEIVED FROM YOUR SURGEON'S OFFICE!!!     Oral Hygiene is also important to reduce your risk of infection.                                    Remember - BRUSH YOUR TEETH THE MORNING OF SURGERY WITH YOUR REGULAR TOOTHPASTE   Do NOT smoke after Midnight   Take these medicines the morning of  surgery with A SIP OF WATER:   Famotidine  Lamotrigine  Levothyroxine  Tylenol if needed  Stop all vitamins and herbal supplements 7 days before surgery                              You may not have any metal  on your body including  jewelry, and body piercing             Do not wear  lotions, powders, cologne, or deodorant              Men may shave face and neck.   Do not bring valuables to the hospital. [facility] IS NOT RESPONSIBLE   FOR VALUABLES.   Contacts, dentures or bridgework may not be worn into surgery.   Bring small overnight bag day of surgery.   DO NOT BRING YOUR HOME MEDICATIONS TO THE HOSPITAL. PHARMACY WILL DISPENSE MEDICATIONS LISTED ON YOUR MEDICATION LIST TO YOU DURING YOUR ADMISSION IN THE HOSPITAL!     Special Instructions: Bring a copy of your healthcare power of attorney and living will documents the day of surgery if you haven't scanned them before.              Please read over the following fact sheets you were given: IF YOU HAVE QUESTIONS ABOUT YOUR PRE-OP INSTRUCTIONS PLEASE CALL [phone]  I If you test positive for Covid or have been in contact with anyone that has tested positive in the last 10 days please notify you surgeon.  [facility] - Preparing for Surgery Before surgery, you can play an important role.  Because skin is not sterile, your skin needs to be as free of germs as possible.  You can reduce the number of germs on your skin by washing with CHG (chlorahexidine gluconate) soap before surgery.  CHG is an antiseptic cleaner which kills germs and bonds with the skin to continue killing germs even after washing. Please DO NOT use if you have an allergy to CHG or antibacterial soaps.  If your skin becomes reddened/irritated stop using the CHG and inform your nurse when you arrive at Short Stay. Do not shave (including legs and underarms) for at least 48 hours prior to the first CHG shower.  You may shave your face/neck.  Please follow  these instructions carefully:  1.  Shower with CHG Soap the night before surgery and the  morning of surgery.  2.  If you choose to wash your hair, wash your hair first as usual with your normal  shampoo.  3.  After you shampoo, rinse your hair and body thoroughly to remove the shampoo.                             4.  Use CHG as you would any other liquid soap.  You can apply chg directly to the skin and wash.  Gently with a scrungie or clean washcloth.  5.  Apply the CHG Soap to your body ONLY FROM THE NECK DOWN.   Do   not use on face/ open                           Wound or open sores. Avoid contact with eyes, ears mouth and   genitals (private parts).                       Wash face,  Genitals (private parts) with your normal soap.             6.  Wash thoroughly, paying special attention to the area where your    surgery  will be performed.  7.  Thoroughly rinse your body with warm water from the  neck down.  8.  DO NOT shower/wash with your normal soap after using and rinsing off the CHG Soap.                9.  Pat yourself dry with a clean towel.            10.  Wear clean pajamas.            11.  Place clean sheets on your bed the night of your first shower and do not  sleep with pets. Day of Surgery : Do not apply any lotions/deodorants the morning of surgery.  Please wear clean clothes to the hospital/surgery center.  FAILURE TO FOLLOW THESE INSTRUCTIONS MAY RESULT IN THE CANCELLATION OF YOUR SURGERY  PATIENT SIGNATURE_________________________________  NURSE SIGNATURE__________________________________  ________________________________________________________________________   WHAT IS A BLOOD TRANSFUSION? Blood Transfusion Information  A transfusion is the replacement of blood or some of its parts. Blood is made up of multiple cells which provide different functions. Red blood cells carry oxygen and are used for blood loss replacement. White blood cells fight against  infection. Platelets control bleeding. Plasma helps clot blood. Other blood products are available for specialized needs, such as hemophilia or other clotting disorders. BEFORE THE TRANSFUSION  Who gives blood for transfusions?  Healthy volunteers who are fully evaluated to make sure their blood is safe. This is blood bank blood. Transfusion therapy is the safest it has ever been in the practice of medicine. Before blood is taken from a donor, a complete history is taken to make sure that person has no history of diseases nor engages in risky social behavior (examples are intravenous drug use or sexual activity with multiple partners). The donor's travel history is screened to minimize risk of transmitting infections, such as malaria. The donated blood is tested for signs of infectious diseases, such as HIV and hepatitis. The blood is then tested to be sure it is compatible with you in order to minimize the chance of a transfusion reaction. If you or a relative donates blood, this is often done in anticipation of surgery and is not appropriate for emergency situations. It takes many days to process the donated blood. RISKS AND COMPLICATIONS Although transfusion therapy is very safe and saves many lives, the main dangers of transfusion include:  Getting an infectious disease. Developing a transfusion reaction. This is an allergic reaction to something in the blood you were given. Every precaution is taken to prevent this. The decision to have a blood transfusion has been considered carefully by your caregiver before blood is given. Blood is not given unless the benefits outweigh the risks. AFTER THE TRANSFUSION Right after receiving a blood transfusion, you will usually feel much better and more energetic. This is especially true if your red blood cells have gotten low (anemic). The transfusion raises the level of the red blood cells which carry oxygen, and this usually causes an energy increase. The  nurse administering the transfusion will monitor you carefully for complications. HOME CARE INSTRUCTIONS  No special instructions are needed after a transfusion. You may find your energy is better. Speak with your caregiver about any limitations on activity for underlying diseases you may have. SEEK MEDICAL CARE IF:  Your condition is not improving after your transfusion. You develop redness or irritation at the intravenous (IV) site. SEEK IMMEDIATE MEDICAL CARE IF:  Any of the following symptoms occur over the next 12 hours: Shaking chills. You have a temperature by mouth above 102 F (  38.9 C), not controlled by medicine. Chest, back, or muscle pain. People around you feel you are not acting correctly or are confused. Shortness of breath or difficulty breathing. Dizziness and fainting. You get a rash or develop hives. You have a decrease in urine output. Your urine turns a dark color or changes to pink, red, or brown. Any of the following symptoms occur over the next 10 days: You have a temperature by mouth above 102 F (38.9 C), not controlled by medicine. Shortness of breath. Weakness after normal activity. The white part of the eye turns yellow (jaundice). You have a decrease in the amount of urine or are urinating less often. Your urine turns a dark color or changes to pink, red, or brown. Document Released: 04/29/2000 Document Revised: 07/25/2011 Document Reviewed: 12/17/2007 [facility] Patient Information 2014 [place], [place].  _______________________________________________________________________

## 2023-02-23 NOTE — Progress Notes (Signed)
Surgery orders requested via Epic inbox. °

## 2023-02-24 ENCOUNTER — Encounter (HOSPITAL_COMMUNITY)
Admission: RE | Admit: 2023-02-24 | Discharge: 2023-02-24 | Disposition: A | Discharge status: Home / Self Care | Payer: Commercial Managed Care - PPO | Source: Ambulatory Visit | Attending: Surgery | Admitting: Surgery

## 2023-02-24 ENCOUNTER — Encounter (HOSPITAL_COMMUNITY): Payer: Self-pay

## 2023-02-24 ENCOUNTER — Other Ambulatory Visit: Payer: Self-pay

## 2023-02-24 VITALS — BP 113/79 | HR 102 | Temp 98.0°F | Resp 16 | Ht 75.0 in | Wt 218.0 lb

## 2023-02-24 DIAGNOSIS — Z01812 Encounter for preprocedural laboratory examination: Secondary | ICD-10-CM | POA: Diagnosis not present

## 2023-02-24 DIAGNOSIS — Z01818 Encounter for other preprocedural examination: Secondary | ICD-10-CM

## 2023-02-24 HISTORY — DX: Pneumonia, unspecified organism: J18.9

## 2023-02-24 LAB — CBC
HCT: 47 % (ref 39.0–52.0)
Hemoglobin: 16 g/dL (ref 13.0–17.0)
MCH: 29.9 pg (ref 26.0–34.0)
MCHC: 34 g/dL (ref 30.0–36.0)
MCV: 87.7 fL (ref 80.0–100.0)
Platelets: 221 10*3/uL (ref 150–400)
RBC: 5.36 MIL/uL (ref 4.22–5.81)
RDW: 14 % (ref 11.5–15.5)
WBC: 6.6 10*3/uL (ref 4.0–10.5)
nRBC: 0 % (ref 0.0–0.2)

## 2023-02-24 NOTE — Progress Notes (Addendum)
PCP - Cheri Rous , MD Cardiologist - no  PPM/ICD -  Device Orders -  Rep Notified -   Chest x-ray -  EKG -  Stress Test -  ECHO - 10-15-08 CE Cardiac Cath -   Sleep Study -  CPAP -   Fasting Blood Sugar -  Checks Blood Sugar _____ times a day  Blood Thinner Instructions: Aspirin Instructions:  ERAS Protcol - PRE-SURGERY No orders in epic   COVID vaccine -yes  Activity--Able to climb a flight of stairs without Cp or SOB Anesthesia review:   Patient denies shortness of breath, fever, cough and chest pain at PAT appointment   All instructions explained to the patient, with a verbal understanding of the material. Patient agrees to go over the instructions while at home for a better understanding. Patient also instructed to self quarantine after being tested for COVID-19. The opportunity to ask questions was provided.

## 2023-02-27 ENCOUNTER — Ambulatory Visit: Payer: Self-pay | Admitting: Surgery

## 2023-02-27 DIAGNOSIS — Z01818 Encounter for other preprocedural examination: Secondary | ICD-10-CM

## 2023-03-08 NOTE — Anesthesia Preprocedure Evaluation (Signed)
Anesthesia Evaluation  Patient identified by MRN, date of birth, ID band Patient awake    Reviewed: Allergy & Precautions, NPO status , Patient's Chart, lab work & pertinent test results  History of Anesthesia Complications Negative for: history of anesthetic complications  Airway Mallampati: II  TM Distance: >3 FB Neck ROM: Full    Dental no notable dental hx.    Pulmonary neg pulmonary ROS   Pulmonary exam normal        Cardiovascular negative cardio ROS Normal cardiovascular exam     Neuro/Psych   Anxiety Depression    negative neurological ROS     GI/Hepatic Neg liver ROS,GERD  Controlled,,perorated rectosigmoid colon s/p colostomy   Endo/Other  Hypothyroidism    Renal/GU negative Renal ROS  negative genitourinary   Musculoskeletal negative musculoskeletal ROS (+)    Abdominal   Peds  Hematology negative hematology ROS (+)   Anesthesia Other Findings Day of surgery medications reviewed with patient.  Reproductive/Obstetrics negative OB ROS                              Anesthesia Physical Anesthesia Plan  ASA: 2  Anesthesia Plan: General   Post-op Pain Management: Tylenol PO (pre-op)*, Ketamine IV* and Dilaudid IV   Induction: Intravenous  PONV Risk Score and Plan: 2 and Treatment may vary due to age or medical condition, Ondansetron, Dexamethasone and Midazolam  Airway Management Planned: Oral ETT  Additional Equipment: None  Intra-op Plan:   Post-operative Plan: Extubation in OR  Informed Consent: I have reviewed the patients History and Physical, chart, labs and discussed the procedure including the risks, benefits and alternatives for the proposed anesthesia with the patient or authorized representative who has indicated his/her understanding and acceptance.     Dental advisory given  Plan Discussed with: CRNA  Anesthesia Plan Comments:          Anesthesia Quick Evaluation

## 2023-03-09 ENCOUNTER — Other Ambulatory Visit: Payer: Self-pay

## 2023-03-09 ENCOUNTER — Inpatient Hospital Stay (HOSPITAL_COMMUNITY): Payer: Commercial Managed Care - PPO | Admitting: Anesthesiology

## 2023-03-09 ENCOUNTER — Encounter (HOSPITAL_COMMUNITY)
Admission: RE | Disposition: A | Discharge status: Home / Self Care | Payer: Self-pay | Source: Home / Self Care | Attending: Surgery

## 2023-03-09 ENCOUNTER — Inpatient Hospital Stay (HOSPITAL_COMMUNITY)
Admission: RE | Admit: 2023-03-09 | Discharge: 2023-03-11 | DRG: 337 | Disposition: A | Discharge status: Home / Self Care | Payer: Commercial Managed Care - PPO | Attending: Surgery | Admitting: Surgery

## 2023-03-09 ENCOUNTER — Encounter (HOSPITAL_COMMUNITY): Payer: Self-pay | Admitting: Surgery

## 2023-03-09 DIAGNOSIS — Z9049 Acquired absence of other specified parts of digestive tract: Secondary | ICD-10-CM | POA: Diagnosis not present

## 2023-03-09 DIAGNOSIS — Z8719 Personal history of other diseases of the digestive system: Secondary | ICD-10-CM

## 2023-03-09 DIAGNOSIS — K219 Gastro-esophageal reflux disease without esophagitis: Secondary | ICD-10-CM | POA: Diagnosis present

## 2023-03-09 DIAGNOSIS — F32A Depression, unspecified: Secondary | ICD-10-CM | POA: Diagnosis present

## 2023-03-09 DIAGNOSIS — N3090 Cystitis, unspecified without hematuria: Secondary | ICD-10-CM | POA: Diagnosis present

## 2023-03-09 DIAGNOSIS — E039 Hypothyroidism, unspecified: Secondary | ICD-10-CM | POA: Diagnosis present

## 2023-03-09 DIAGNOSIS — F419 Anxiety disorder, unspecified: Secondary | ICD-10-CM | POA: Diagnosis present

## 2023-03-09 DIAGNOSIS — E785 Hyperlipidemia, unspecified: Secondary | ICD-10-CM | POA: Diagnosis present

## 2023-03-09 DIAGNOSIS — Z01818 Encounter for other preprocedural examination: Secondary | ICD-10-CM

## 2023-03-09 DIAGNOSIS — Z9889 Other specified postprocedural states: Principal | ICD-10-CM

## 2023-03-09 DIAGNOSIS — K660 Peritoneal adhesions (postprocedural) (postinfection): Secondary | ICD-10-CM | POA: Diagnosis present

## 2023-03-09 DIAGNOSIS — Z23 Encounter for immunization: Secondary | ICD-10-CM

## 2023-03-09 DIAGNOSIS — Z433 Encounter for attention to colostomy: Secondary | ICD-10-CM | POA: Diagnosis present

## 2023-03-09 HISTORY — PX: LYSIS OF ADHESION: SHX5961

## 2023-03-09 HISTORY — PX: XI ROBOTIC ASSISTED COLOSTOMY TAKEDOWN: SHX6828

## 2023-03-09 HISTORY — PX: FLEXIBLE SIGMOIDOSCOPY: SHX5431

## 2023-03-09 LAB — TYPE AND SCREEN
ABO/RH(D): A POS
ABO/RH(D): A POS
Antibody Screen: NEGATIVE
Antibody Screen: NEGATIVE

## 2023-03-09 LAB — ABO/RH: ABO/RH(D): A POS

## 2023-03-09 SURGERY — CLOSURE, COLOSTOMY, ROBOT-ASSISTED
Anesthesia: General

## 2023-03-09 MED ORDER — KETAMINE HCL 10 MG/ML IJ SOLN
INTRAMUSCULAR | Status: DC | PRN
  Administered 2023-03-09: 30 mg via INTRAVENOUS
  Administered 2023-03-09: 10 mg via INTRAVENOUS

## 2023-03-09 MED ORDER — SIMETHICONE 80 MG PO CHEW
40.0000 mg | CHEWABLE_TABLET | Freq: Four times a day (QID) | ORAL | Status: DC | PRN
  Administered 2023-03-09 – 2023-03-11 (×3): 40 mg via ORAL
  Filled 2023-03-09 (×3): qty 1

## 2023-03-09 MED ORDER — ALVIMOPAN 12 MG PO CAPS
12.0000 mg | ORAL_CAPSULE | Freq: Two times a day (BID) | ORAL | Status: DC
  Administered 2023-03-10 (×2): 12 mg via ORAL
  Filled 2023-03-09 (×2): qty 1

## 2023-03-09 MED ORDER — MIDAZOLAM HCL 2 MG/2ML IJ SOLN
INTRAMUSCULAR | Status: AC
  Filled 2023-03-09: qty 2

## 2023-03-09 MED ORDER — IBUPROFEN 400 MG PO TABS
600.0000 mg | ORAL_TABLET | Freq: Four times a day (QID) | ORAL | Status: DC | PRN
  Administered 2023-03-09 – 2023-03-11 (×3): 600 mg via ORAL
  Filled 2023-03-09 (×3): qty 1

## 2023-03-09 MED ORDER — LIDOCAINE 2% (20 MG/ML) 5 ML SYRINGE
INTRAMUSCULAR | Status: DC | PRN
  Administered 2023-03-09: 1.5 mg/kg/h via INTRAVENOUS

## 2023-03-09 MED ORDER — KETAMINE HCL 50 MG/5ML IJ SOSY
PREFILLED_SYRINGE | INTRAMUSCULAR | Status: AC
Start: 2023-03-09 — End: ?
  Filled 2023-03-09: qty 5

## 2023-03-09 MED ORDER — HEPARIN SODIUM (PORCINE) 5000 UNIT/ML IJ SOLN
5000.0000 [IU] | Freq: Once | INTRAMUSCULAR | Status: AC
  Administered 2023-03-09: 5000 [IU] via SUBCUTANEOUS
  Filled 2023-03-09: qty 1

## 2023-03-09 MED ORDER — ONDANSETRON HCL 4 MG/2ML IJ SOLN
INTRAMUSCULAR | Status: DC | PRN
  Administered 2023-03-09: 4 mg via INTRAVENOUS

## 2023-03-09 MED ORDER — POLYETHYLENE GLYCOL 3350 17 GM/SCOOP PO POWD: 1.0000 | Freq: Once | ORAL | Status: DC

## 2023-03-09 MED ORDER — DEXTROSE-SODIUM CHLORIDE 5-0.45 % IV SOLN: INTRAVENOUS | Status: DC

## 2023-03-09 MED ORDER — BUPIVACAINE-EPINEPHRINE (PF) 0.25% -1:200000 IJ SOLN
INTRAMUSCULAR | Status: DC | PRN
  Administered 2023-03-09: 30 mL via PERINEURAL

## 2023-03-09 MED ORDER — DEXAMETHASONE SODIUM PHOSPHATE 4 MG/ML IJ SOLN
INTRAMUSCULAR | Status: DC | PRN
  Administered 2023-03-09: 5 mg via INTRAVENOUS

## 2023-03-09 MED ORDER — CHLORHEXIDINE GLUCONATE 0.12 % MT SOLN
15.0000 mL | Freq: Once | OROMUCOSAL | Status: AC
Start: 2023-03-09 — End: 2023-03-09
  Administered 2023-03-09: 15 mL via OROMUCOSAL

## 2023-03-09 MED ORDER — ACETAMINOPHEN 500 MG PO TABS
ORAL_TABLET | ORAL | Status: AC
  Filled 2023-03-09: qty 2

## 2023-03-09 MED ORDER — BUPIVACAINE LIPOSOME 1.3 % IJ SUSP
INTRAMUSCULAR | Status: DC | PRN
  Administered 2023-03-09: 20 mL

## 2023-03-09 MED ORDER — ENSURE SURGERY PO LIQD
237.0000 mL | Freq: Two times a day (BID) | ORAL | Status: DC
Start: 2023-03-09 — End: 2023-03-11
  Administered 2023-03-09 – 2023-03-11 (×4): 237 mL via ORAL

## 2023-03-09 MED ORDER — ONDANSETRON HCL 4 MG/2ML IJ SOLN: 4.0000 mg | Freq: Four times a day (QID) | INTRAMUSCULAR | Status: DC | PRN

## 2023-03-09 MED ORDER — ACETAMINOPHEN 500 MG PO TABS: 1000.0000 mg | ORAL_TABLET | Freq: Once | ORAL | Status: DC

## 2023-03-09 MED ORDER — LACTATED RINGERS IR SOLN
Status: DC | PRN
  Administered 2023-03-09: 1000 mL

## 2023-03-09 MED ORDER — METRONIDAZOLE 500 MG PO TABS: 1000.0000 mg | ORAL_TABLET | ORAL | Status: DC

## 2023-03-09 MED ORDER — HYDRALAZINE HCL 20 MG/ML IJ SOLN: 10.0000 mg | INTRAMUSCULAR | Status: DC | PRN

## 2023-03-09 MED ORDER — DROPERIDOL 2.5 MG/ML IJ SOLN
INTRAMUSCULAR | Status: AC
  Filled 2023-03-09: qty 2

## 2023-03-09 MED ORDER — LACTATED RINGERS IV SOLN: INTRAVENOUS | Status: DC | PRN

## 2023-03-09 MED ORDER — BUPIVACAINE-EPINEPHRINE 0.25% -1:200000 IJ SOLN
INTRAMUSCULAR | Status: AC
  Filled 2023-03-09: qty 1

## 2023-03-09 MED ORDER — HYDROMORPHONE HCL 1 MG/ML IJ SOLN: 0.5000 mg | INTRAMUSCULAR | Status: DC | PRN

## 2023-03-09 MED ORDER — ACETAMINOPHEN 500 MG PO TABS
1000.0000 mg | ORAL_TABLET | Freq: Four times a day (QID) | ORAL | Status: DC
  Administered 2023-03-09 – 2023-03-11 (×8): 1000 mg via ORAL
  Filled 2023-03-09 (×7): qty 2

## 2023-03-09 MED ORDER — PROPOFOL 10 MG/ML IV BOLUS
INTRAVENOUS | Status: AC
Start: 2023-03-09 — End: ?
  Filled 2023-03-09: qty 20

## 2023-03-09 MED ORDER — PROPOFOL 10 MG/ML IV BOLUS
INTRAVENOUS | Status: AC
  Filled 2023-03-09: qty 20

## 2023-03-09 MED ORDER — ENSURE PRE-SURGERY PO LIQD: 592.0000 mL | Freq: Once | ORAL | Status: DC

## 2023-03-09 MED ORDER — ACETAMINOPHEN 500 MG PO TABS
1000.0000 mg | ORAL_TABLET | ORAL | Status: AC
  Administered 2023-03-09: 1000 mg via ORAL
  Filled 2023-03-09: qty 2

## 2023-03-09 MED ORDER — SUGAMMADEX SODIUM 200 MG/2ML IV SOLN
INTRAVENOUS | Status: DC | PRN
  Administered 2023-03-09: 360 mg via INTRAVENOUS

## 2023-03-09 MED ORDER — CHLORHEXIDINE GLUCONATE CLOTH 2 % EX PADS: 6.0000 | MEDICATED_PAD | Freq: Once | CUTANEOUS | Status: DC

## 2023-03-09 MED ORDER — PHENYLEPHRINE HCL-NACL 20-0.9 MG/250ML-% IV SOLN
INTRAVENOUS | Status: DC | PRN
  Administered 2023-03-09: 25 ug/min via INTRAVENOUS

## 2023-03-09 MED ORDER — DIPHENHYDRAMINE HCL 12.5 MG/5ML PO ELIX: 12.5000 mg | ORAL_SOLUTION | Freq: Four times a day (QID) | ORAL | Status: DC | PRN

## 2023-03-09 MED ORDER — ORAL CARE MOUTH RINSE: 15.0000 mL | Freq: Once | OROMUCOSAL | Status: AC

## 2023-03-09 MED ORDER — BISACODYL 5 MG PO TBEC: 20.0000 mg | DELAYED_RELEASE_TABLET | Freq: Once | ORAL | Status: DC

## 2023-03-09 MED ORDER — HYDROMORPHONE HCL 1 MG/ML IJ SOLN
INTRAMUSCULAR | Status: AC
  Administered 2023-03-09: 0.5 mg via INTRAVENOUS
  Filled 2023-03-09: qty 1

## 2023-03-09 MED ORDER — TRAMADOL HCL 50 MG PO TABS
50.0000 mg | ORAL_TABLET | Freq: Four times a day (QID) | ORAL | Status: DC | PRN
  Administered 2023-03-09 – 2023-03-11 (×5): 50 mg via ORAL
  Filled 2023-03-09 (×5): qty 1

## 2023-03-09 MED ORDER — ATORVASTATIN CALCIUM 10 MG PO TABS
10.0000 mg | ORAL_TABLET | Freq: Every evening | ORAL | Status: DC
  Administered 2023-03-10: 10 mg via ORAL
  Filled 2023-03-09: qty 1

## 2023-03-09 MED ORDER — LAMOTRIGINE 100 MG PO TABS
200.0000 mg | ORAL_TABLET | Freq: Two times a day (BID) | ORAL | Status: DC
  Administered 2023-03-09 – 2023-03-11 (×4): 200 mg via ORAL
  Filled 2023-03-09 (×4): qty 2

## 2023-03-09 MED ORDER — ENSURE PRE-SURGERY PO LIQD: 296.0000 mL | Freq: Once | ORAL | Status: DC

## 2023-03-09 MED ORDER — BUPIVACAINE LIPOSOME 1.3 % IJ SUSP: 20.0000 mL | Freq: Once | INTRAMUSCULAR | Status: DC

## 2023-03-09 MED ORDER — ROCURONIUM BROMIDE 100 MG/10ML IV SOLN
INTRAVENOUS | Status: DC | PRN
  Administered 2023-03-09: 30 mg via INTRAVENOUS
  Administered 2023-03-09: 70 mg via INTRAVENOUS

## 2023-03-09 MED ORDER — DEXMEDETOMIDINE HCL IN NACL 80 MCG/20ML IV SOLN
INTRAVENOUS | Status: DC | PRN
  Administered 2023-03-09 (×2): 12 ug via INTRAVENOUS

## 2023-03-09 MED ORDER — LACTATED RINGERS IV SOLN: INTRAVENOUS | Status: DC

## 2023-03-09 MED ORDER — STERILE WATER FOR INJECTION IJ SOLN
INTRAMUSCULAR | Status: AC
  Filled 2023-03-09: qty 10

## 2023-03-09 MED ORDER — SODIUM CHLORIDE 0.9 % IV SOLN
2.0000 g | INTRAVENOUS | Status: AC
  Administered 2023-03-09: 2 g via INTRAVENOUS
  Filled 2023-03-09: qty 2

## 2023-03-09 MED ORDER — BUPROPION HCL ER (XL) 150 MG PO TB24
300.0000 mg | ORAL_TABLET | Freq: Every day | ORAL | Status: DC
  Administered 2023-03-10 – 2023-03-11 (×2): 300 mg via ORAL
  Filled 2023-03-09 (×2): qty 2

## 2023-03-09 MED ORDER — NEOMYCIN SULFATE 500 MG PO TABS: 1000.0000 mg | ORAL_TABLET | ORAL | Status: DC

## 2023-03-09 MED ORDER — ONDANSETRON HCL 4 MG/2ML IJ SOLN
INTRAMUSCULAR | Status: AC
  Filled 2023-03-09: qty 2

## 2023-03-09 MED ORDER — STERILE WATER FOR INJECTION IJ SOLN
INTRAMUSCULAR | Status: DC | PRN
  Administered 2023-03-09: 15 mL via INTRAMUSCULAR

## 2023-03-09 MED ORDER — DIPHENHYDRAMINE HCL 50 MG/ML IJ SOLN: 12.5000 mg | Freq: Four times a day (QID) | INTRAMUSCULAR | Status: DC | PRN

## 2023-03-09 MED ORDER — LEVOTHYROXINE SODIUM 88 MCG PO TABS
88.0000 ug | ORAL_TABLET | Freq: Every day | ORAL | Status: DC
  Administered 2023-03-10 – 2023-03-11 (×2): 88 ug via ORAL
  Filled 2023-03-09 (×2): qty 1

## 2023-03-09 MED ORDER — FENTANYL CITRATE (PF) 250 MCG/5ML IJ SOLN
INTRAMUSCULAR | Status: AC
  Filled 2023-03-09: qty 5

## 2023-03-09 MED ORDER — DEXAMETHASONE SODIUM PHOSPHATE 10 MG/ML IJ SOLN
INTRAMUSCULAR | Status: AC
Start: 2023-03-09 — End: ?
  Filled 2023-03-09: qty 1

## 2023-03-09 MED ORDER — ALVIMOPAN 12 MG PO CAPS
12.0000 mg | ORAL_CAPSULE | ORAL | Status: AC
  Administered 2023-03-09: 12 mg via ORAL
  Filled 2023-03-09: qty 1

## 2023-03-09 MED ORDER — LIDOCAINE HCL (CARDIAC) PF 100 MG/5ML IV SOSY
PREFILLED_SYRINGE | INTRAVENOUS | Status: DC | PRN
  Administered 2023-03-09: 100 mg via INTRAVENOUS

## 2023-03-09 MED ORDER — 0.9 % SODIUM CHLORIDE (POUR BTL) OPTIME
TOPICAL | Status: DC | PRN
  Administered 2023-03-09: 1000 mL

## 2023-03-09 MED ORDER — ACETYLCYSTEINE 500 MG PO CAPS: 1800.0000 mg | ORAL_CAPSULE | Freq: Two times a day (BID) | ORAL | Status: DC

## 2023-03-09 MED ORDER — HEPARIN SODIUM (PORCINE) 5000 UNIT/ML IJ SOLN
5000.0000 [IU] | Freq: Three times a day (TID) | INTRAMUSCULAR | Status: DC
Start: 2023-03-09 — End: 2023-03-11
  Administered 2023-03-09 – 2023-03-11 (×6): 5000 [IU] via SUBCUTANEOUS
  Filled 2023-03-09 (×6): qty 1

## 2023-03-09 MED ORDER — FENTANYL CITRATE (PF) 100 MCG/2ML IJ SOLN
INTRAMUSCULAR | Status: DC | PRN
  Administered 2023-03-09 (×2): 100 ug via INTRAVENOUS
  Administered 2023-03-09: 50 ug via INTRAVENOUS

## 2023-03-09 MED ORDER — DROPERIDOL 2.5 MG/ML IJ SOLN
0.6250 mg | Freq: Once | INTRAMUSCULAR | Status: AC | PRN
  Administered 2023-03-09: 0.625 mg via INTRAVENOUS

## 2023-03-09 MED ORDER — ONDANSETRON HCL 4 MG PO TABS: 4.0000 mg | ORAL_TABLET | Freq: Four times a day (QID) | ORAL | Status: DC | PRN

## 2023-03-09 MED ORDER — HYDROMORPHONE HCL 1 MG/ML IJ SOLN
INTRAMUSCULAR | Status: AC
  Administered 2023-03-09: 0.25 mg via INTRAVENOUS
  Filled 2023-03-09: qty 1

## 2023-03-09 MED ORDER — HYDROMORPHONE HCL 1 MG/ML IJ SOLN
0.2500 mg | INTRAMUSCULAR | Status: DC | PRN
Start: 2023-03-09 — End: 2023-03-09
  Administered 2023-03-09: 0.25 mg via INTRAVENOUS
  Administered 2023-03-09: 0.5 mg via INTRAVENOUS

## 2023-03-09 MED ORDER — BUPIVACAINE LIPOSOME 1.3 % IJ SUSP
INTRAMUSCULAR | Status: AC
  Filled 2023-03-09: qty 20

## 2023-03-09 MED ORDER — PROPOFOL 10 MG/ML IV BOLUS
INTRAVENOUS | Status: DC | PRN
  Administered 2023-03-09: 200 mg via INTRAVENOUS

## 2023-03-09 MED ORDER — FAMOTIDINE 20 MG PO TABS
20.0000 mg | ORAL_TABLET | Freq: Every day | ORAL | Status: DC
  Administered 2023-03-09 – 2023-03-11 (×3): 20 mg via ORAL
  Filled 2023-03-09 (×3): qty 1

## 2023-03-09 MED ORDER — CABERGOLINE 0.5 MG PO TABS: 0.2500 mg | ORAL_TABLET | ORAL | Status: DC

## 2023-03-09 MED ORDER — ALUM & MAG HYDROXIDE-SIMETH 200-200-20 MG/5ML PO SUSP: 30.0000 mL | Freq: Four times a day (QID) | ORAL | Status: DC | PRN

## 2023-03-09 SURGICAL SUPPLY — 123 items
ADAPTER GOLDBERG URETERAL (ADAPTER) IMPLANT
ADPR CATH 15X14FR FL DRN BG (ADAPTER)
APL PRP STRL LF DISP 70% ISPRP (MISCELLANEOUS) ×1
APPLIER CLIP 5 13 M/L LIGAMAX5 (MISCELLANEOUS)
APPLIER CLIP ROT 10 11.4 M/L (STAPLE)
APR CLP MED LRG 11.4X10 (STAPLE)
APR CLP MED LRG 5 ANG JAW (MISCELLANEOUS)
BAG COUNTER SPONGE SURGICOUNT (BAG) IMPLANT
BAG SPNG CNTER NS LX DISP (BAG)
BAG URO CATCHER STRL LF (MISCELLANEOUS) ×1 IMPLANT
BLADE EXTENDED COATED 6.5IN (ELECTRODE) ×1 IMPLANT
BNDG GAUZE DERMACEA FLUFF 4 (GAUZE/BANDAGES/DRESSINGS) IMPLANT
BNDG GZE DERMACEA 4 6PLY (GAUZE/BANDAGES/DRESSINGS) ×1
CANNULA REDUCER 12-8 DVNC XI (CANNULA) ×1 IMPLANT
CATH URETL OPEN 5X70 (CATHETERS) IMPLANT
CHLORAPREP W/TINT 26 (MISCELLANEOUS) ×1 IMPLANT
CLIP APPLIE 5 13 M/L LIGAMAX5 (MISCELLANEOUS) IMPLANT
CLIP APPLIE ROT 10 11.4 M/L (STAPLE) IMPLANT
CLIP LIGATING HEM O LOK PURPLE (MISCELLANEOUS) IMPLANT
CLIP LIGATING HEMO O LOK GREEN (MISCELLANEOUS) IMPLANT
CLOTH BEACON ORANGE TIMEOUT ST (SAFETY) ×1 IMPLANT
COVER SURGICAL LIGHT HANDLE (MISCELLANEOUS) ×2 IMPLANT
COVER TIP SHEARS 8 DVNC (MISCELLANEOUS) ×1 IMPLANT
DEFOGGER SCOPE WARMER CLEARIFY (MISCELLANEOUS) ×1 IMPLANT
DEVICE TROCAR PUNCTURE CLOSURE (ENDOMECHANICALS) IMPLANT
DRAIN CHANNEL 19F RND (DRAIN) ×1 IMPLANT
DRAPE ARM DVNC X/XI (DISPOSABLE) ×4 IMPLANT
DRAPE COLUMN DVNC XI (DISPOSABLE) ×1 IMPLANT
DRAPE SURG IRRIG POUCH 19X23 (DRAPES) ×1 IMPLANT
DRIVER NDL LRG 8 DVNC XI (INSTRUMENTS) ×1 IMPLANT
DRIVER NDLE LRG 8 DVNC XI (INSTRUMENTS) ×1
DRSG OPSITE POSTOP 4X10 (GAUZE/BANDAGES/DRESSINGS) IMPLANT
DRSG OPSITE POSTOP 4X6 (GAUZE/BANDAGES/DRESSINGS) IMPLANT
DRSG OPSITE POSTOP 4X8 (GAUZE/BANDAGES/DRESSINGS) IMPLANT
DRSG TEGADERM 2-3/8X2-3/4 SM (GAUZE/BANDAGES/DRESSINGS) ×5 IMPLANT
DRSG TEGADERM 4X4.75 (GAUZE/BANDAGES/DRESSINGS) ×1 IMPLANT
ELECT REM PT RETURN 15FT ADLT (MISCELLANEOUS) ×1 IMPLANT
ENDOLOOP SUT PDS II 0 18 (SUTURE) IMPLANT
EVACUATOR SILICONE 100CC (DRAIN) ×1 IMPLANT
GAUZE SPONGE 2X2 8PLY STRL LF (GAUZE/BANDAGES/DRESSINGS) ×1 IMPLANT
GAUZE SPONGE 4X4 12PLY STRL (GAUZE/BANDAGES/DRESSINGS) IMPLANT
GLOVE BIO SURGEON STRL SZ7.5 (GLOVE) ×3 IMPLANT
GLOVE INDICATOR 8.0 STRL GRN (GLOVE) ×3 IMPLANT
GLOVE SURG LX STRL 7.5 STRW (GLOVE) ×1 IMPLANT
GOWN SRG XL LVL 4 BRTHBL STRL (GOWNS) ×1 IMPLANT
GOWN STRL NON-REIN XL LVL4 (GOWNS) ×1
GOWN STRL REUS W/ TWL XL LVL3 (GOWN DISPOSABLE) ×6 IMPLANT
GOWN STRL REUS W/TWL XL LVL3 (GOWN DISPOSABLE) ×6
GRASPER SUT TROCAR 14GX15 (MISCELLANEOUS) IMPLANT
GRASPER TIP-UP FEN DVNC XI (INSTRUMENTS) ×1 IMPLANT
GUIDEWIRE ANG ZIPWIRE 038X150 (WIRE) IMPLANT
GUIDEWIRE STR DUAL SENSOR (WIRE) IMPLANT
HOLDER FOLEY CATH W/STRAP (MISCELLANEOUS) ×1 IMPLANT
IRRIG SUCT STRYKERFLOW 2 WTIP (MISCELLANEOUS) ×1
IRRIGATION SUCT STRKRFLW 2 WTP (MISCELLANEOUS) ×1 IMPLANT
KIT PROCEDURE DVNC SI (MISCELLANEOUS) ×1 IMPLANT
KIT TURNOVER KIT A (KITS) IMPLANT
MANIFOLD NEPTUNE II (INSTRUMENTS) ×1 IMPLANT
NDL INSUFFLATION 14GA 120MM (NEEDLE) ×1 IMPLANT
NEEDLE INSUFFLATION 14GA 120MM (NEEDLE) ×1
PACK CARDIOVASCULAR III (CUSTOM PROCEDURE TRAY) ×1 IMPLANT
PACK COLON (CUSTOM PROCEDURE TRAY) ×1 IMPLANT
PACK CYSTO (CUSTOM PROCEDURE TRAY) ×1 IMPLANT
PAD CAST 4YDX4 CTTN HI CHSV (CAST SUPPLIES) IMPLANT
PAD POSITIONING PINK XL (MISCELLANEOUS) ×1 IMPLANT
PAD PREP 24X48 CUFFED NSTRL (MISCELLANEOUS) ×1 IMPLANT
PADDING CAST COTTON 4X4 STRL (CAST SUPPLIES) ×1
PENCIL SMOKE EVACUATOR (MISCELLANEOUS) IMPLANT
PROTECTOR NERVE ULNAR (MISCELLANEOUS) ×2 IMPLANT
RELOAD STAPLE 45 3.5 BLU DVNC (STAPLE) IMPLANT
RELOAD STAPLE 45 4.3 GRN DVNC (STAPLE) IMPLANT
RELOAD STAPLE 60 3.5 BLU DVNC (STAPLE) IMPLANT
RELOAD STAPLE 60 4.3 GRN DVNC (STAPLE) IMPLANT
RETRACTOR WND ALEXIS 18 MED (MISCELLANEOUS) IMPLANT
RTRCTR WOUND ALEXIS 18CM MED (MISCELLANEOUS)
SCISSORS LAP 5X35 DISP (ENDOMECHANICALS) IMPLANT
SCISSORS MNPLR CVD DVNC XI (INSTRUMENTS) ×1 IMPLANT
SEAL UNIV 5-12 XI (MISCELLANEOUS) ×4 IMPLANT
SEALER VESSEL EXT DVNC XI (MISCELLANEOUS) ×1 IMPLANT
SLEEVE ADV FIXATION 5X100MM (TROCAR) IMPLANT
SOL ELECTROSURG ANTI STICK (MISCELLANEOUS) ×1
SOLUTION ELECTROSURG ANTI STCK (MISCELLANEOUS) ×1 IMPLANT
SPIKE FLUID TRANSFER (MISCELLANEOUS) ×1 IMPLANT
STAPLER 60 SUREFORM DVNC (STAPLE) IMPLANT
STAPLER ECHELON POWER CIR 29 (STAPLE) IMPLANT
STAPLER ECHELON POWER CIR 31 (STAPLE) IMPLANT
STAPLER RELOAD 3.5X45 BLU DVNC (STAPLE)
STAPLER RELOAD 3.5X60 BLU DVNC (STAPLE)
STAPLER RELOAD 4.3X45 GRN DVNC (STAPLE)
STAPLER RELOAD 4.3X60 GRN DVNC (STAPLE)
STOPCOCK 4 WAY LG BORE MALE ST (IV SETS) ×2 IMPLANT
SURGILUBE 2OZ TUBE FLIPTOP (MISCELLANEOUS) ×1 IMPLANT
SUT MNCRL AB 4-0 PS2 18 (SUTURE) ×1 IMPLANT
SUT PDS AB 1 CT1 27 (SUTURE) IMPLANT
SUT PDS AB 1 TP1 96 (SUTURE) IMPLANT
SUT PROLENE 0 CT 2 (SUTURE) IMPLANT
SUT PROLENE 2 0 KS (SUTURE) ×1 IMPLANT
SUT PROLENE 2 0 SH DA (SUTURE) IMPLANT
SUT SILK 2 0 (SUTURE)
SUT SILK 2 0 SH CR/8 (SUTURE) IMPLANT
SUT SILK 2-0 18XBRD TIE 12 (SUTURE) IMPLANT
SUT SILK 3 0 (SUTURE) ×1
SUT SILK 3 0 SH CR/8 (SUTURE) ×1 IMPLANT
SUT SILK 3-0 18XBRD TIE 12 (SUTURE) ×1 IMPLANT
SUT V-LOC BARB 180 2/0GR6 GS22 (SUTURE)
SUT VIC AB 3-0 SH 18 (SUTURE) IMPLANT
SUT VIC AB 3-0 SH 27 (SUTURE)
SUT VIC AB 3-0 SH 27XBRD (SUTURE) IMPLANT
SUT VICRYL 0 UR6 27IN ABS (SUTURE) ×1 IMPLANT
SUTURE V-LC BRB 180 2/0GR6GS22 (SUTURE) IMPLANT
SYR 10ML LL (SYRINGE) ×1 IMPLANT
SYS LAPSCP GELPORT 120MM (MISCELLANEOUS)
SYS WOUND ALEXIS 18CM MED (MISCELLANEOUS) ×1
SYSTEM LAPSCP GELPORT 120MM (MISCELLANEOUS) IMPLANT
SYSTEM WOUND ALEXIS 18CM MED (MISCELLANEOUS) ×1 IMPLANT
TAPE PAPER 3X10 WHT MICROPORE (GAUZE/BANDAGES/DRESSINGS) IMPLANT
TAPE UMBILICAL 1/8 X36 TWILL (MISCELLANEOUS) ×1 IMPLANT
TOWEL OR NON WOVEN STRL DISP B (DISPOSABLE) ×1 IMPLANT
TRAY FOLEY MTR SLVR 16FR STAT (SET/KITS/TRAYS/PACK) ×1 IMPLANT
TROCAR ADV FIXATION 5X100MM (TROCAR) ×1 IMPLANT
TUBING CONNECTING 10 (TUBING) ×4 IMPLANT
TUBING INSUFFLATION 10FT LAP (TUBING) ×1 IMPLANT
TUBING UROLOGY SET (TUBING) IMPLANT

## 2023-03-09 NOTE — H&P (Signed)
CC: Here today for surgery  HPI: George Cooley is an 42 y.o. male with history of HLD, hypothyroidism, depression, adhd whom is seen in the office today as a referral by Dr. Luisa Hart for evaluation of colostomy status and possible reversal down the road.  OR 09/10/22 - Dr. Luisa Hart - exlap/Hartmann's for perforation of the rectosigmoid junction. There was a noted history of daily self enemas. He had presented to the emergency room after one of his enemas. He was ultimately found to have perforation of his distal sigmoid colon was taken to the OR for this reason. He was found to have a perforation at least 10 cm above the peritoneal fold. A Hartman's procedure was carried out uneventfully.  He was discharged home 09/16/2022.  PATH A. SIGMOID COLON, RESECTION:  Perforation with focal mucosal necrosis, acute inflammation, pericolonic  abscess and acute serositis.  Negative for malignancy.   Colonoscopy 12/01/2022 with Dr. Leonides Schanz demonstrates: - Normal terminal ileum - Two 3-5 mm polyps in the descending and transverse colon, removed with a cold snare. - Stool in the rectum, interfering with visualization.  Reports no hx of constipation, enemas had been for pleasure purposes daily. He has not had enemas since. Denies any history of difficulty evacuating stool, incontinence to gas, liquid, or solid stool.  PMH: HLD, hypothyroidism, depression, adhd  PSH: Exlap/Hartmann's - denies any other known abdominal/pelvic surgical hx; ankle surgery remotely  FHx: Denies any known family history of colorectal, breast, endometrial or ovarian cancer  Social Hx: Denies use of tobacco/illicit drug. Social EtOH use; works as an Technical sales engineer   Past Medical History:  Diagnosis Date   ADHD    Anxiety    Depression    GERD (gastroesophageal reflux disease)    Hypothyroidism    Pneumonia     Past Surgical History:  Procedure Laterality Date   LAPAROTOMY N/A 09/10/2022   Procedure: EXPLORATORY LAPAROTOMY  WITH COLOSTOMY;  Surgeon: Harriette Bouillon, MD;  Location: [facility];  Service: General;  Laterality: N/A;   ORIF ANKLE FRACTURE Right 04/30/2020   Procedure: OPEN TREATMENT OF RIGHT TRIMALLEOLAR FRACTURE POSSIBLE SYNDESMOSIS;  Surgeon: Terance Hart, MD;  Location: [facility];  Service: Orthopedics;  Laterality: Right;  LENGTH OF SURGERY 1.5 HOURS   tendon on toe repair     veriocele embolization      Family History  Problem Relation Age of Onset   Colon cancer Neg Hx    Rectal cancer Neg Hx    Stomach cancer Neg Hx    Esophageal cancer Neg Hx    Liver cancer Neg Hx    Pancreatic cancer Neg Hx     Social:  reports that he has never smoked. He has never used smokeless tobacco. He reports current alcohol use of about 1.0 standard drink of alcohol per week. He reports that he does not use drugs.  Allergies: No Known Allergies  Medications: I have reviewed the patient's current medications.  No results found for this or any previous visit (from the past 48 hour(s)).  No results found.  ROS - all of the below systems have been reviewed with the patient and positives are indicated with bold text General: chills, fever or night sweats Eyes: blurry vision or double vision ENT: epistaxis or sore throat Allergy/Immunology: itchy/watery eyes or nasal congestion Hematologic/Lymphatic: bleeding problems, blood clots or swollen lymph nodes Endocrine: temperature intolerance or unexpected weight changes Breast: new or changing breast lumps or nipple discharge Resp: cough, shortness of breath,  or wheezing CV: chest pain or dyspnea on exertion GI: as per HPI GU: dysuria, trouble voiding, or hematuria MSK: joint pain or joint stiffness Neuro: TIA or stroke symptoms Derm: pruritus and skin lesion changes Psych: anxiety and depression  PE Blood pressure 114/80, pulse (!) 108, temperature 98.5 F (36.9 C), temperature source Oral, resp. rate 18, height 6\' 3"  (1.905 m),  weight 98.9 kg, SpO2 98%. Constitutional: NAD; conversant Eyes: Moist conjunctiva Lungs: Normal respiratory effort CV: RRR GI: Abd soft, NT/ND MSK: Normal range of motion of extremities Psychiatric: Appropriate affect  No results found for this or any previous visit (from the past 48 hour(s)).  No results found.    A/P: George Cooley is an 42 y.o. male with hx of HLD, hypothyroidism, depression, adhd here for evaluation of colostomy status, highly motivated to have reversed  -GGE Clear  -We discussed our recommendation of lifelong no foreign bodies per rectum or any further enemas, ever. We discussed risks of recurrent perforation, potential for permanent colostomy. He expressed understanding of all this and agreement with this plan.  -The anatomy and physiology of the GI tract was reviewed with the patient as it pertains to his current anatomy. He is highly motivated to have this reversed for quality-of-life reasons. We discussed that this is a "elective" procedure in the sense that you can live a normal life with a colostomy. -We have therefore discussed robotic-assisted colostomy reversal, possible lysis of adhesions, possible low anterior resection based on intraoperative findings, flexible sigmoidoscopy; cystoscopy/ureteral ICG ([facility]). -We discussed scenarios where an laparotomy may also be carried out based on findings intraoperatively  -The planned procedure, material risks (including, but not limited to, pain, bleeding, infection, scarring, need for blood transfusion, damage to surrounding structures- blood vessels/nerves/viscus/organs, damage to ureter, urine leak, leak from anastomosis, need for additional procedures, sexual dysfunction, low anterior resection syndrome (LARS) = increased fecal urgency and/or frequency, scenarios where a stoma may be again necessary and where it may be permanent, worsening of pre-existing medical conditions, chronic diarrhea,  constipation secondary to narcotic use, hernia, recurrence, pneumonia, heart attack, stroke, death) benefits and alternatives to surgery were discussed at length. The patient's questions were answered to his satisfaction, he voiced understanding and elected to proceed with surgery. Additionally, we discussed typical postoperative expectations and the recovery process; we discussed potential wound care plans as well and general expectations therein.   Marin Olp, MD [facility], A [facility]

## 2023-03-09 NOTE — Transfer of Care (Signed)
Immediate Anesthesia Transfer of Care Note  Patient: George Cooley  Procedure(s) Performed: XI ROBOTIC ASSISTED COLOSTOMY TAKEDOWN LYSIS OF ADHESION FLEXIBLE SIGMOIDOSCOPY CYSTOSCOPY with FIREFLY INJECTION  Patient Location: PACU  Anesthesia Type:General  Level of Consciousness: awake and alert   Airway & Oxygen Therapy: Patient Spontanous Breathing and Patient connected to face mask oxygen  Post-op Assessment: Report given to RN and Post -op Vital signs reviewed and stable  Post vital signs: Reviewed and stable  Last Vitals:  Vitals Value Taken Time  BP 130/83 03/09/23 1018  Temp    Pulse 100 03/09/23 1020  Resp 14 03/09/23 1020  SpO2 100 % 03/09/23 1020  Vitals shown include unfiled device data.  Last Pain:  Vitals:   03/09/23 0556  TempSrc: Oral         Complications: No notable events documented.

## 2023-03-09 NOTE — Anesthesia Postprocedure Evaluation (Signed)
Anesthesia Post Note  Patient: George Cooley  Procedure(s) Performed: XI ROBOTIC ASSISTED COLOSTOMY TAKEDOWN LYSIS OF ADHESION FLEXIBLE SIGMOIDOSCOPY CYSTOSCOPY with FIREFLY INJECTION     Patient location during evaluation: PACU Anesthesia Type: General Level of consciousness: awake and alert Pain management: pain level controlled Vital Signs Assessment: post-procedure vital signs reviewed and stable Respiratory status: spontaneous breathing, nonlabored ventilation and respiratory function stable Cardiovascular status: blood pressure returned to baseline Postop Assessment: no apparent nausea or vomiting Anesthetic complications: no   No notable events documented.  Last Vitals:  Vitals:   03/09/23 1045 03/09/23 1100  BP: 121/85 129/87  Pulse: 96 98  Resp: 16 (!) 9  Temp:    SpO2: 95% 97%    Last Pain:  Vitals:   03/09/23 1200  TempSrc:   PainSc: 6                  Shanda Howells

## 2023-03-09 NOTE — Op Note (Signed)
Preoperative diagnosis:  Reversal of colostomy Postoperative diagnosis:  Same   Procedure: Cystoscopy Instillation of ureteral firefly constrast   Surgeon: Crist Fat, MD   Anesthesia: General   Complications: None   Intraoperative findings: Normal bladder with mild evidence of cystitis.  UOs orthotopic.   EBL: Minimal   Specimens: None   Indication:  George Cooley  is a 42 y.o.  patient with perforated sigmoid s/p diversion who presents for reversal of his ostomy.  Dr. Cliffton Asters requested cystoscopy and instillation of firefly contrast to help facilitate the dissection of the sigmoid colon.  After reviewing the management options for treatment, he elected to proceed with the above surgical procedure(s). We have discussed the potential benefits and risks of the procedure, side effects of the proposed treatment, the likelihood of the patient achieving the goals of the procedure, and any potential problems that might occur during the procedure or recuperation. Informed consent has been obtained.   Description of procedure:   The patient was taken to the operating room and general anesthesia was induced.  The patient was placed in the dorsal lithotomy position, prepped and draped in the usual sterile fashion, and preoperative antibiotics were administered. A preoperative time-out was performed.    A 21 French 30 degree cystoscope was gently passed through the patient's urethra into the bladder.  The bladder was subsequently emptied and then filled slowly up performing a 360 degrees cystoscopic evaluation.  This demonstrated orthotopic ureteral orifices, normal bladder mucosa with an area of heaped mucosa and bullous edema in the dome -  evidence of colovesical fistula without mucosal abnormality.   I then advanced a 5 [place] open-ended ureteral catheter into the patient's left ureteral orifice and  I then advanced the catheter up into the proximal ureter and then slowly pulled back  and injected 7.39ml of the firefly contrast.  Subsequently turned my attention to the patient's right ureteral orifice and performed a similar task.   I then  placed a 16 [place] Foley.    The surgery was then turned over to Dr. Cliffton Asters for facilitation of the remainder of the case.

## 2023-03-09 NOTE — Anesthesia Procedure Notes (Signed)
Procedure Name: Intubation Date/Time: 03/09/2023 8:02 AM  Performed by: Deri Fuelling, CRNAPre-anesthesia Checklist: Patient identified, Emergency Drugs available, Suction available and Patient being monitored Patient Re-evaluated:Patient Re-evaluated prior to induction Oxygen Delivery Method: Circle system utilized Preoxygenation: Pre-oxygenation with 100% oxygen Induction Type: IV induction Ventilation: Mask ventilation without difficulty Laryngoscope Size: Mac and 4 Grade View: Grade I Tube type: Oral Tube size: 7.5 mm Number of attempts: 1 Airway Equipment and Method: Stylet and Oral airway Placement Confirmation: ETT inserted through vocal cords under direct vision, positive ETCO2 and breath sounds checked- equal and bilateral Secured at: 23 cm Tube secured with: Tape Dental Injury: Teeth and Oropharynx as per pre-operative assessment

## 2023-03-09 NOTE — Discharge Instructions (Addendum)
POST OP INSTRUCTIONS AFTER COLON SURGERY  DIET: Be sure to include lots of fluids daily to stay hydrated - 64oz of water per day (8, 8 oz glasses).  Avoid fast food or heavy meals for the first couple of weeks as your are more likely to get nauseated. Avoid raw/uncooked fruits or vegetables for the first 4 weeks (its ok to have these if they are blended into smoothie form). If you have fruits/vegetables, make sure they are cooked until soft enough to mash on the roof of your mouth and chew your food well. Otherwise, diet as tolerated.  Take your usually prescribed home medications unless otherwise directed.  PAIN CONTROL: Pain is best controlled by a usual combination of three different methods TOGETHER: Ice/Heat Over the counter pain medication Prescription pain medication Most patients will experience some swelling and bruising around the surgical site.  Ice packs or heating pads (30-60 minutes up to 6 times a day) will help. Some people prefer to use ice alone, heat alone, alternating between ice & heat.  Experiment to what works for you.  Swelling and bruising can take several weeks to resolve.   It is helpful to take an over-the-counter pain medication regularly for the first few weeks: Ibuprofen (Motrin/Advil) - 200mg  tabs - take 3 tabs (600mg ) every 6 hours as needed for pain (unless you have been directed previously to avoid NSAIDs/ibuprofen) Acetaminophen (Tylenol) - you may take 650mg  every 6 hours as needed. You can take this with motrin as they act differently on the body. If you are taking a narcotic pain medication that has acetaminophen in it, do not take over the counter tylenol at the same time. NOTE: You may take both of these medications together - most patients find it most helpful when alternating between the two (i.e. Ibuprofen at 6am, tylenol at 9am, ibuprofen at 12pm ..Marland Kitchen) A  prescription for pain medication should be given to you upon discharge.  Take your pain medication as  prescribed if your pain is not adequatly controlled with the over-the-counter pain reliefs mentioned above.  Avoid getting constipated.  Between the surgery and the pain medications, it is common to experience some constipation.  Increasing fluid intake and taking a fiber supplement (such as Metamucil, Citrucel, FiberCon, MiraLax, etc) 1-2 times a day regularly will usually help prevent this problem from occurring.  A mild laxative (prune juice, Milk of Magnesia, MiraLax, etc) should be taken according to package directions if there are no bowel movements after 48 hours.    Dressing: Your incisions are covered in Dermabond which is like sterile superglue for the skin. This will come off on it's own in a couple weeks. It is waterproof and you may bathe normally starting the day after your surgery in a shower. Avoid baths/pools/lakes/oceans until your wounds have fully healed.  The former colostomy site may be covered with dry gauze and changed once daily.  It is okay to get your wound wet in the shower with soap/water over the wound.  Again, avoid baths/pools/lakes/oceans until this is fully healed.  ACTIVITIES as tolerated:   Avoid heavy lifting (>10lbs or 1 gallon of milk) for the next 6 weeks. You may resume regular daily activities as tolerated--such as daily self-care, walking, climbing stairs--gradually increasing activities as tolerated.  If you can walk 30 minutes without difficulty, it is safe to try more intense activity such as jogging, treadmill, bicycling, low-impact aerobics.  DO NOT PUSH THROUGH PAIN.  Let pain be your guide: If it  hurts to do something, don't do it. You may drive when you are no longer taking prescription pain medication, you can comfortably wear a seatbelt, and you can safely maneuver your car and apply brakes. No foreign bodies per rectum indefinitely  FOLLOW UP in our office Please call [facility] at [phone] to set up an appointment to see your surgeon in the office  for a follow-up appointment approximately 2 weeks after your surgery. Make sure that you call for this appointment the day you arrive home to insure a convenient appointment time.  9. If you have disability or family leave forms that need to be completed, you may have them completed by your primary care physician's office; for return to work instructions, please ask our office staff and they will be happy to assist you in obtaining this documentation   When to call us [phone]: Poor pain control Reactions / problems with new medications (rash/itching, etc)  Fever over 101.5 F (38.5 C) Inability to urinate Nausea/vomiting Worsening swelling or bruising Continued bleeding from incision. Increased pain, redness, or drainage from the incision  The clinic staff is available to answer your questions during regular business hours (8:30am-5pm).  Please don't hesitate to call and ask to speak to one of our nurses for clinical concerns.   A surgeon from [facility] is always on call at the hospitals   If you have a medical emergency, go to the nearest emergency room or call 911.  [facility] [address], [place], [place]  [address] MAIN: [phone] FAX: [phone] [email or web address]

## 2023-03-09 NOTE — Op Note (Signed)
PATIENT: George Cooley  42 y.o. male  Patient Care Team: Nonnie Done., MD as PCP - General (Family Medicine) Patient, No Pcp Per (General Practice)  PREOP DIAGNOSIS: COLOSTOMY STATUS  POSTOP DIAGNOSIS: COLOSTOMY STATUS  PROCEDURE:  Robotic assisted colostomy takedown Robotic lysis of adhesions x 90 minutes Flexible sigmoidoscopy Bilateral transversus abdominus plane (TAP) blocks  SURGEON: Stephanie Coup. Cliffton Asters, MD  ASSISTANT: Romie Levee, MD  ANESTHESIA: General endotracheal  EBL: 50 mL Total I/O In: 1000 [I.V.:900; IV Piggyback:100] Out: 50 [Blood:50]  DRAINS: None  SPECIMEN: Colostomy  COUNTS: Sponge, needle and instrument counts were reported correct x2  FINDINGS: Omental containing adhesions across the midline from his prior laparotomy.  Adhesions of omentum to his colostomy.  Adhesions of small bowel to the descending mesocolon.  Some adhesions of small bowel around the rectal stump as well.  Healthy appearing rectal stump.  A well perfused, tension free, hemostatic, air tight 29 mm EEA colorectal anastomosis fashioned 15 cm from the anal verge by flexible sigmoidoscopy.   NARRATIVE: Informed consent was verified. The patient was taken to the operating room, placed supine on the operating table and SCD's were applied. General endotracheal anesthesia was induced without difficulty.  He was then positioned in the lithotomy position with Allen stirrups.  Pressure points were evaluated and padded.  A foley catheter was then placed by nursing under sterile conditions. Hair on the abdomen was clipped.  He was secured to the operating table.  Dr. Marlou Porch with [facility] scrubbed for his portion of the procedure.  Please refer to his notes for details.  The abdomen was then prepped and draped in the standard sterile fashion. Surgical timeout was called indicating the correct patient, procedure, positioning and need for preoperative antibiotics.   An OG tube was placed  by anesthesia and confirmed to be to suction.  At Palmer's point, a stab incision was created and the Veress needle was introduced into the peritoneal cavity on the first attempt.  Intraperitoneal location was confirmed by the aspiration and saline drop test.  Pneumoperitoneum was established to a maximum pressure of 15 mmHg using CO2.  Following this, the abdomen was marked for planned trocar sites.  Just to the right and cephalad to the umbilicus, an 8 mm incision was created and an 8 mm blunt tipped robotic trocar was cautiously placed into the peritoneal cavity.  The laparoscope was inserted and demonstrated no evidence of trocar site nor Veress needle site complications.  The Veress needle was removed.  Bilateral transversus abdominis plane blocks were then created using a dilute mixture of Exparel with Marcaine.  3 additional 8 mm robotic trochars were placed under direct visualization roughly in a line extending from the right ASIS towards the left upper quadrant. The bladder was inspected and noted to be at/below the pubic symphysis.  Staying 3 fingerbreadths above the pubic symphysis, an incision was created and the 12 mm robotic trocar inserted directed cephalad into the peritoneal cavity under direct visualization.  An additional 5 mm assist port was placed in the right lateral abdomen under direct visualization.  The abdomen was surveyed and there were omental containing adhesions across the lower midline abdomen from prior laparotomy. He was positioned in Trendelenburg with the left side tilted slightly up.  Evident small bowel adhesions in the pelvis as well.  The robot was then docked and I went to the console.  We began with adhesiolysis. Adhesions consisting of omentum were carefully taken down from the midline.  There are no small bowel containing adhesions to the midline.  Omental containing adhesions were then taken down sharply off of the descending mesocolon and colon going up to the  abdominal wall.  There are also small bowel containing adhesions to the descending mesocolon which were carefully lysed sharply using scissors.  The loops of small bowel that were lysed were then carefully inspected and there is no evident serosal injury.  Adhesions consisting of small bowel are also noted going into the pelvis and adherent to the mesorectum lateral to his rectal stump.  These were also carefully lysed sharply.  After freeing all these adhesions, these individual bowel loops were also carefully inspected and no injuries identified.  The small bowel was then run to ensure no missed injuries and there were none identified.   The rectosigmoid stump is able to be identified.  This is circumferentially dissected using a TME plane for mobilization purposes.  The space between the fascia propria the rectum and the presacral fascia is identified and delineated.  The hypogastric nerves were protected free of injury.  We are able to identify the left and right ureters using near-infrared light.  We were able to maintain a plane well medial to these.  The rectosigmoid staple line is healthy in appearance with intact staples.  At this location, the tinea have already splayed and there are no appendices epiploica.  Anatomically, this is felt to represent the proximal extent of the rectum.  There is no evident narrowing.  I then went below to perform an anorectal exam and under direct visualization is able to introduce EEA sizers carefully through the anal canal and up into the rectum.  The rectal stump easily accommodates a 29 mm EEA sizer.  I then went back to the console. All adhesions have been separated from the colostomy at the abdominal wall.  The descending colon had been mobilized all the way up to the level of the splenic flexure by incising the Vedh Ptacek line of Toldt and there was more than adequate reach to the deep pelvis without any further mobilization.  Attention was then directed at the  colostomy.  I scrubbed back in.  The colostomy was circumferentially incised.  The subcutaneous fat was divided.  The colostomy was able to be circumferentially dissected free of the surrounding fascia.  The colostomy was then inspected and there is no evidence of injury.  Just proximal to the mucocutaneous junction, the mesentery was cleared and ligated.  A pursestring device was then placed.  A 2-0 Prolene on a Keith needle was passed.  The colostomy was excised and passed off as specimen.  3-0 silk sutures were used to create belt loops around the pursestring.  EEA sizers were passed and a 29 mm EEA selected.  The anvil was placed in the pursestring tied.  A small amount of fat was cleared from the planned staple line.  Quality of the tissues is good. There is a palpable pulse in the marginal artery going out to the pursestring region of the colon.  This is placed back into the abdomen.  An Alexis wound protector was placed. A cap is placed and pneumoperitoneum reestablished.  The anvil reaches into the deep pelvis without any tension and remains in that location.  I then went below to pass the stapler.  Under direct visualization, EEA sizers were serially passed.  The 29 mm EEA stapler was passed.  The spike is deployed just anterior to the staple line.  The components  were then mated.  Orientation is confirmed such that there is no twisting of the colon or small bowel underneath the mesenteric defect.  The stapler was then closed, held, and fired.  Colon proximally anastomosis is gently occluded.  The pelvis was filled with irrigation.  I passed the flexible sigmoidoscope to perform a leak test.  The anastomosis is hemostatic in appearance and airtight.  All tissues are pink in color. This is located at 15 cm from the anal verge by flexible sigmoidoscopy. Additionally, looking from above, there is no tension on the colon or mesentery.  Sigmoidoscope was withdrawn. I scrubbed back in. Irrigation was evacuated  from the pelvis.  The abdomen and pelvis are surveyed and noted to be completely hemostatic without any apparent injury.  Under direct visualization, all trochars are removed.  The Alexis wound protector was removed.    The rectus fascia at the former colostomy site was then closed using 2 running #1 PDS sutures.  This is able to be done without any significant tension.  The fascia was then palpated and noted to be completely closed.  Additional anesthetic was infiltrated at this site.   Sponge, needle, and instrument counts were reported correct x2. 4-0 Monocryl subcuticular suture was used to close the skin of all incision sites.  Dermabond was placed over all incisions.  A 2-0 Vicryl pursestring suture was used to cinch the skin down at the former colostomy site.  The wound is then wicked with a moist kerlex.  Additional gauze was placed and secured with tape.  He was then taken out of lithotomy, awakened from anesthesia, extubated, and transferred to a stretcher for transport to PACU in satisfactory condition having tolerated the procedure well.

## 2023-03-10 ENCOUNTER — Encounter (HOSPITAL_COMMUNITY): Payer: Self-pay | Admitting: Surgery

## 2023-03-10 LAB — CBC
HCT: 43.5 % (ref 39.0–52.0)
Hemoglobin: 14.4 g/dL (ref 13.0–17.0)
MCH: 29.6 pg (ref 26.0–34.0)
MCHC: 33.1 g/dL (ref 30.0–36.0)
MCV: 89.5 fL (ref 80.0–100.0)
Platelets: 253 10*3/uL (ref 150–400)
RBC: 4.86 MIL/uL (ref 4.22–5.81)
RDW: 14.2 % (ref 11.5–15.5)
WBC: 14.4 10*3/uL — ABNORMAL HIGH (ref 4.0–10.5)
nRBC: 0 % (ref 0.0–0.2)

## 2023-03-10 LAB — BASIC METABOLIC PANEL
Anion gap: 10 (ref 5–15)
BUN: 9 mg/dL (ref 6–20)
CO2: 23 mmol/L (ref 22–32)
Calcium: 8.4 mg/dL — ABNORMAL LOW (ref 8.9–10.3)
Chloride: 103 mmol/L (ref 98–111)
Creatinine, Ser: 0.98 mg/dL (ref 0.61–1.24)
GFR, Estimated: >60 mL/min (ref >60)
Glucose, Bld: 106 mg/dL — ABNORMAL HIGH (ref 70–99)
Potassium: 3.4 mmol/L — ABNORMAL LOW (ref 3.5–5.1)
Sodium: 136 mmol/L (ref 135–145)

## 2023-03-10 MED ORDER — INFLUENZA VIRUS VACC SPLIT PF (FLUZONE) 0.5 ML IM SUSY
0.5000 mL | PREFILLED_SYRINGE | INTRAMUSCULAR | Status: AC
  Administered 2023-03-11: 0.5 mL via INTRAMUSCULAR
  Filled 2023-03-10: qty 0.5

## 2023-03-10 NOTE — Plan of Care (Signed)

## 2023-03-10 NOTE — Progress Notes (Signed)
   03/10/23 0842  TOC Brief Assessment  Insurance and Status Reviewed  Patient has primary care physician Yes  Home environment has been reviewed Resides at home with spouse  Prior level of function: Independent at baseline  Prior/Current Home Services No current home services  Social Determinants of Health Reivew SDOH reviewed no interventions necessary  Readmission risk has been reviewed Yes  Transition of care needs no transition of care needs at this time

## 2023-03-10 NOTE — Progress Notes (Signed)
  Subjective No acute events. Doing well, no complaints. Denies nausea/vomiting. Tolerating liquids without issue. Ambulating. Pain well controlled.  Objective: Vital signs in last 24 hours: Temp:  [97.7 F (36.5 C)-98.7 F (37.1 C)] 97.8 F (36.6 C) (10/25 0617) Pulse Rate:  [87-109] 99 (10/25 0617) Resp:  [9-25] 18 (10/25 0617) BP: (115-146)/(68-131) 129/88 (10/25 0617) SpO2:  [95 %-100 %] 100 % (10/25 0617) Weight:  [99.1 kg] 99.1 kg (10/25 0600) Last BM Date : 03/09/23  Intake/Output from previous day: 10/24 0701 - 10/25 0700 In: 3266.2 [P.O.:1250; I.V.:1916.2; IV Piggyback:100] Out: 2840 [Urine:2790; Blood:50] Intake/Output this shift: No intake/output data recorded.  Gen: NAD, comfortable CV: RRR Pulm: Normal work of breathing Abd: Soft, not significantly tender nor distended Ext: SCDs in place  Lab Results: CBC  Recent Labs    03/10/23 0456  WBC 14.4*  HGB 14.4  HCT 43.5  PLT 253   BMET Recent Labs    03/10/23 0456  NA 136  K 3.4*  CL 103  CO2 23  GLUCOSE 106*  BUN 9  CREATININE 0.98  CALCIUM 8.4*   PT/INR No results for input(s): "LABPROT", "INR" in the last 72 hours. ABG No results for input(s): "PHART", "HCO3" in the last 72 hours.  Invalid input(s): "PCO2", "PO2"  Studies/Results:  Anti-infectives: Anti-infectives (From admission, onward)    Start     Dose/Rate Route Frequency Ordered Stop   03/09/23 1400  neomycin (MYCIFRADIN) tablet 1,000 mg  Status:  Discontinued       Placed in "And" Linked Group   1,000 mg Oral 3 times per day 03/09/23 0533 03/09/23 0540   03/09/23 1400  metroNIDAZOLE (FLAGYL) tablet 1,000 mg  Status:  Discontinued       Placed in "And" Linked Group   1,000 mg Oral 3 times per day 03/09/23 0533 03/09/23 0540   03/09/23 0600  cefoTEtan (CEFOTAN) 2 g in sodium chloride 0.9 % 100 mL IVPB        2 g 200 mL/hr over 30 Minutes Intravenous On call to O.R. 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 Discussed that this weekend my partners would be by to see him   LOS: 1 day   Marin Olp, MD [facility]

## 2023-03-11 LAB — BASIC METABOLIC PANEL
Anion gap: 9 (ref 5–15)
BUN: 11 mg/dL (ref 6–20)
CO2: 27 mmol/L (ref 22–32)
Calcium: 8.5 mg/dL — ABNORMAL LOW (ref 8.9–10.3)
Chloride: 102 mmol/L (ref 98–111)
Creatinine, Ser: 0.98 mg/dL (ref 0.61–1.24)
GFR, Estimated: >60 mL/min (ref >60)
Glucose, Bld: 91 mg/dL (ref 70–99)
Potassium: 3.7 mmol/L (ref 3.5–5.1)
Sodium: 138 mmol/L (ref 135–145)

## 2023-03-11 LAB — CBC
HCT: 46.2 % (ref 39.0–52.0)
Hemoglobin: 15 g/dL (ref 13.0–17.0)
MCH: 29.3 pg (ref 26.0–34.0)
MCHC: 32.5 g/dL (ref 30.0–36.0)
MCV: 90.2 fL (ref 80.0–100.0)
Platelets: 266 10*3/uL (ref 150–400)
RBC: 5.12 MIL/uL (ref 4.22–5.81)
RDW: 14.3 % (ref 11.5–15.5)
WBC: 10.3 10*3/uL (ref 4.0–10.5)
nRBC: 0 % (ref 0.0–0.2)

## 2023-03-11 MED ORDER — OXYCODONE HCL 5 MG PO TABS: 5.0000 mg | ORAL_TABLET | Freq: Four times a day (QID) | ORAL | 0 refills | Status: AC | PRN

## 2023-03-11 NOTE — Plan of Care (Signed)

## 2023-03-11 NOTE — Progress Notes (Signed)
Assessment unchanged. Pt verbalized understanding of dc instructions including medications, follow up care and when to call the doctor. Discharged via wc to front entrance to meet wife accompanied by NT.

## 2023-03-11 NOTE — Discharge Summary (Signed)
Physician Discharge Summary  Patient ID: George Cooley MRN: [id number] DOB/AGE: [birth year] 42 y.o.  Admit date: 03/09/2023 Discharge date: 03/11/2023  Admission Diagnoses: Colostomy in place Discharge Diagnoses:  Principal Problem:   S/P colostomy takedown   Discharged Condition: good  Hospital Course: Patient did well with robotic colostomy closure.  On postop day 1 his diet was advanced.  He had a bowel movement.  By postop day 2, he is tolerating his diet, had bowel movements and a good pain control.  He was discharged home postoperative day 2.     Treatments: surgery: Robotic colostomy closure  Discharge Exam: Blood pressure (!) 123/91, pulse 86, temperature (!) 97.4 F (36.3 C), temperature source Oral, resp. rate 16, height 6\' 3"  (1.905 m), weight 97.8 kg, SpO2 99%.  Lungs: Clear to auscultation  Cardiovascular: Regular in rhythm  Abdomen: Soft nontender.  Packing removed from left lower quadrant colostomy site covered with clean dry gauze.  No distention.  Wound is clean.   Disposition: Discharge disposition: 01-Home or Self Care       Discharge Instructions     Diet - low sodium heart healthy   Complete by: As directed    Increase activity slowly   Complete by: As directed       Allergies as of 03/11/2023   No Known Allergies      Medication List     TAKE these medications    acetaminophen 500 MG tablet Commonly known as: TYLENOL Take 2 tablets (1,000 mg total) by mouth every 6 (six) hours as needed.   amphetamine-dextroamphetamine 25 MG 24 hr capsule Commonly known as: ADDERALL XR Take 25 mg by mouth every morning.   atorvastatin 10 MG tablet Commonly known as: LIPITOR Take 10 mg by mouth every evening.   buPROPion 300 MG 24 hr tablet Commonly known as: WELLBUTRIN XL Take 300 mg by mouth daily.   cabergoline 0.5 MG tablet Commonly known as: DOSTINEX Take 0.25 mg by mouth 2 (two) times a week. Mondays and Thursdays   famotidine  20 MG tablet Commonly known as: PEPCID Take 20 mg by mouth daily.   FIBER SELECT GUMMIES PO Take 2 tablets by mouth daily. 5g/per gummies   ibuprofen 200 MG tablet Commonly known as: Motrin IB Take 3 tablets (600 mg total) by mouth every 8 (eight) hours as needed. What changed: how much to take   lamoTRIgine 200 MG tablet Commonly known as: LAMICTAL Take 200 mg by mouth 2 (two) times daily.   levothyroxine 88 MCG tablet Commonly known as: SYNTHROID Take 88 mcg by mouth daily before breakfast.   multivitamin with minerals tablet Take 1 tablet by mouth daily.   NAC PO Take 1,800 mg by mouth 2 (two) times daily.   oxyCODONE 5 MG immediate release tablet Commonly known as: Oxy IR/ROXICODONE Take 1 tablet (5 mg total) by mouth every 6 (six) hours as needed for severe pain (pain score 7-10).   PROBIOTIC PO Take 2-3 tablets by mouth daily.   testosterone cypionate 200 MG/ML injection Commonly known as: DEPOTESTOSTERONE CYPIONATE Inject 200 mg into the muscle every 14 (fourteen) days.        Follow-up Information     Andria Meuse, MD Follow up on 03/27/2023.   Specialties: General Surgery, Colon and Rectal Surgery Why: Please arrive by 8:45 am Contact information: [address] [place] [address] [place] [address] [phone]  Signed: Dortha Schwalbe MD 03/11/2023, 10:39 AM

## 2023-03-11 NOTE — Plan of Care (Signed)
Problem: Education: Goal: Understanding of discharge needs will improve Outcome: Adequate for Discharge Goal: Verbalization of understanding of the causes of altered bowel function will improve Outcome: Adequate for Discharge   Problem: Activity: Goal: Ability to tolerate increased activity will improve Outcome: Adequate for Discharge   Problem: Bowel/Gastric: Goal: Gastrointestinal status for postoperative course will improve Outcome: Adequate for Discharge   Problem: Health Behavior/Discharge Planning: Goal: Identification of community resources to assist with postoperative recovery needs will improve Outcome: Adequate for Discharge   Problem: Nutritional: Goal: Will attain and maintain optimal nutritional status will improve Outcome: Adequate for Discharge   Problem: Clinical Measurements: Goal: Postoperative complications will be avoided or minimized Outcome: Adequate for Discharge   Problem: Respiratory: Goal: Respiratory status will improve Outcome: Adequate for Discharge   Problem: Skin Integrity: Goal: Will show signs of wound healing Outcome: Adequate for Discharge   Problem: Education: Goal: Knowledge of General Education information will improve Description: Including pain rating scale, medication(s)/side effects and non-pharmacologic comfort measures Outcome: Adequate for Discharge   Problem: Health Behavior/Discharge Planning: Goal: Ability to manage health-related needs will improve Outcome: Adequate for Discharge   Problem: Clinical Measurements: Goal: Ability to maintain clinical measurements within normal limits will improve Outcome: Adequate for Discharge Goal: Will remain free from infection Outcome: Adequate for Discharge Goal: Diagnostic test results will improve Outcome: Adequate for Discharge Goal: Respiratory complications will improve Outcome: Adequate for Discharge Goal: Cardiovascular complication will be avoided Outcome: Adequate for  Discharge   Problem: Activity: Goal: Risk for activity intolerance will decrease Outcome: Adequate for Discharge   Problem: Nutrition: Goal: Adequate nutrition will be maintained Outcome: Adequate for Discharge   Problem: Coping: Goal: Level of anxiety will decrease Outcome: Adequate for Discharge   Problem: Elimination: Goal: Will not experience complications related to bowel motility Outcome: Adequate for Discharge Goal: Will not experience complications related to urinary retention Outcome: Adequate for Discharge   Problem: Pain Management: Goal: General experience of comfort will improve Outcome: Adequate for Discharge   Problem: Safety: Goal: Ability to remain free from injury will improve Outcome: Adequate for Discharge   Problem: Skin Integrity: Goal: Risk for impaired skin integrity will decrease Outcome: Adequate for Discharge   Problem: Education: Goal: Knowledge of General Education information will improve Description: Including pain rating scale, medication(s)/side effects and non-pharmacologic comfort measures Outcome: Adequate for Discharge   Problem: Health Behavior/Discharge Planning: Goal: Ability to manage health-related needs will improve Outcome: Adequate for Discharge   Problem: Clinical Measurements: Goal: Ability to maintain clinical measurements within normal limits will improve Outcome: Adequate for Discharge Goal: Will remain free from infection Outcome: Adequate for Discharge Goal: Diagnostic test results will improve Outcome: Adequate for Discharge Goal: Respiratory complications will improve Outcome: Adequate for Discharge Goal: Cardiovascular complication will be avoided Outcome: Adequate for Discharge   Problem: Activity: Goal: Risk for activity intolerance will decrease Outcome: Adequate for Discharge   Problem: Nutrition: Goal: Adequate nutrition will be maintained Outcome: Adequate for Discharge   Problem: Coping: Goal:  Level of anxiety will decrease Outcome: Adequate for Discharge   Problem: Elimination: Goal: Will not experience complications related to bowel motility Outcome: Adequate for Discharge Goal: Will not experience complications related to urinary retention Outcome: Adequate for Discharge   Problem: Pain Management: Goal: General experience of comfort will improve Outcome: Adequate for Discharge   Problem: Safety: Goal: Ability to remain free from injury will improve Outcome: Adequate for Discharge   Problem: Skin Integrity: Goal: Risk for impaired skin integrity will  decrease Outcome: Adequate for Discharge

## 2023-03-13 LAB — SURGICAL PATHOLOGY

## 2024-02-01 ENCOUNTER — Encounter (HOSPITAL_COMMUNITY): Payer: Self-pay | Admitting: Emergency Medicine

## 2024-02-01 ENCOUNTER — Emergency Department (HOSPITAL_COMMUNITY)

## 2024-02-01 ENCOUNTER — Other Ambulatory Visit: Payer: Self-pay

## 2024-02-01 ENCOUNTER — Emergency Department (HOSPITAL_COMMUNITY)
Admission: EM | Admit: 2024-02-01 | Discharge: 2024-02-01 | Disposition: A | Discharge status: Home / Self Care | Attending: Emergency Medicine | Admitting: Emergency Medicine

## 2024-02-01 DIAGNOSIS — N390 Urinary tract infection, site not specified: Secondary | ICD-10-CM | POA: Insufficient documentation

## 2024-02-01 DIAGNOSIS — R7309 Other abnormal glucose: Secondary | ICD-10-CM | POA: Diagnosis not present

## 2024-02-01 DIAGNOSIS — R739 Hyperglycemia, unspecified: Secondary | ICD-10-CM

## 2024-02-01 DIAGNOSIS — R509 Fever, unspecified: Secondary | ICD-10-CM | POA: Diagnosis present

## 2024-02-01 LAB — RESP PANEL BY RT-PCR (RSV, FLU A&B, COVID)  RVPGX2
Influenza A by PCR: NEGATIVE
Influenza B by PCR: NEGATIVE
Resp Syncytial Virus by PCR: NEGATIVE
SARS Coronavirus 2 by RT PCR: NEGATIVE

## 2024-02-01 LAB — CBC WITH DIFFERENTIAL/PLATELET
Abs Immature Granulocytes: 0.03 K/uL (ref 0.00–0.07)
Basophils Absolute: 0 K/uL (ref 0.0–0.1)
Basophils Relative: 0 %
Eosinophils Absolute: 0 K/uL (ref 0.0–0.5)
Eosinophils Relative: 0 %
HCT: 40.5 % (ref 39.0–52.0)
Hemoglobin: 13.2 g/dL (ref 13.0–17.0)
Immature Granulocytes: 0 %
Lymphocytes Relative: 8 %
Lymphs Abs: 0.7 K/uL (ref 0.7–4.0)
MCH: 27.6 pg (ref 26.0–34.0)
MCHC: 32.6 g/dL (ref 30.0–36.0)
MCV: 84.6 fL (ref 80.0–100.0)
Monocytes Absolute: 0.6 K/uL (ref 0.1–1.0)
Monocytes Relative: 8 %
Neutro Abs: 6.7 K/uL (ref 1.7–7.7)
Neutrophils Relative %: 84 %
Platelets: 225 K/uL (ref 150–400)
RBC: 4.79 MIL/uL (ref 4.22–5.81)
RDW: 13.5 % (ref 11.5–15.5)
WBC: 8.1 K/uL (ref 4.0–10.5)
nRBC: 0 % (ref 0.0–0.2)

## 2024-02-01 LAB — URINALYSIS, W/ REFLEX TO CULTURE (INFECTION SUSPECTED)
Bilirubin Urine: NEGATIVE
Glucose, UA: NEGATIVE mg/dL
Ketones, ur: NEGATIVE mg/dL
Nitrite: POSITIVE — AB
Protein, ur: 30 mg/dL — AB
Specific Gravity, Urine: 1.023 (ref 1.005–1.030)
WBC, UA: >50 WBC/hpf (ref 0–5)
pH: 5 (ref 5.0–8.0)

## 2024-02-01 LAB — COMPREHENSIVE METABOLIC PANEL WITH GFR
ALT: 40 U/L (ref 0–44)
AST: 35 U/L (ref 15–41)
Albumin: 4.3 g/dL (ref 3.5–5.0)
Alkaline Phosphatase: 202 U/L — ABNORMAL HIGH (ref 38–126)
Anion gap: 13 (ref 5–15)
BUN: 16 mg/dL (ref 6–20)
CO2: 23 mmol/L (ref 22–32)
Calcium: 9.2 mg/dL (ref 8.9–10.3)
Chloride: 100 mmol/L (ref 98–111)
Creatinine, Ser: 1.01 mg/dL (ref 0.61–1.24)
GFR, Estimated: >60 mL/min (ref >60)
Glucose, Bld: 116 mg/dL — ABNORMAL HIGH (ref 70–99)
Potassium: 3.9 mmol/L (ref 3.5–5.1)
Sodium: 136 mmol/L (ref 135–145)
Total Bilirubin: 0.5 mg/dL (ref 0.0–1.2)
Total Protein: 7.4 g/dL (ref 6.5–8.1)

## 2024-02-01 LAB — PROTIME-INR
INR: 1.1 (ref 0.8–1.2)
Prothrombin Time: 14.9 s (ref 11.4–15.2)

## 2024-02-01 LAB — I-STAT CG4 LACTIC ACID, ED: Lactic Acid, Venous: 1.1 mmol/L (ref 0.5–1.9)

## 2024-02-01 MED ORDER — CEFDINIR 300 MG PO CAPS: 300.0000 mg | ORAL_CAPSULE | Freq: Two times a day (BID) | ORAL | 0 refills | Status: AC

## 2024-02-01 MED ORDER — CEFTRIAXONE SODIUM 2 G IJ SOLR
2.0000 g | Freq: Once | INTRAMUSCULAR | Status: AC
  Administered 2024-02-01: 2 g via INTRAVENOUS
  Filled 2024-02-01: qty 20

## 2024-02-01 MED ORDER — IBUPROFEN 200 MG PO TABS
600.0000 mg | ORAL_TABLET | Freq: Once | ORAL | Status: AC
  Administered 2024-02-01: 600 mg via ORAL
  Filled 2024-02-01: qty 3

## 2024-02-01 NOTE — ED Notes (Signed)
 Outter left fa

## 2024-02-01 NOTE — ED Notes (Signed)
 Inner left fa

## 2024-02-01 NOTE — Discharge Instructions (Addendum)
 Drink plenty of fluids.  Take acetaminophen  and/or ibuprofen  as needed for fever.  Return to the emergency department if symptoms are getting worse.  It is very important for you to follow-up with the urologist to evaluate for any kind of anatomic problem that might lead to you having an infection.

## 2024-02-01 NOTE — ED Provider Notes (Signed)
 [place] EMERGENCY DEPARTMENT AT [facility] Provider Note   CSN: [id number] Arrival date & time: 02/01/24  0132     Patient presents with: Fever   George Cooley is a 43 y.o. male.   The history is provided by the patient.  Fever  He has history of attention deficit disorder, GERD and comes in with fever for the last 3 days.  Temperature was generally as high as 101-102, but tonight went up to 103.  There have been chills and sweats.  He endorses joint pain.  He denies sore throat or cough.  He does endorse urinary difficulty for the last 3 months where if he is leaning forward it feels like the flow is shut off but it is okay when he is standing upright.  He does have an appointment with his urologist in the next week.    Prior to Admission medications   Medication Sig Start Date End Date Taking? Authorizing Provider  acetaminophen  (TYLENOL ) 500 MG tablet Take 2 tablets (1,000 mg total) by mouth every 6 (six) hours as needed. 09/16/22   Tammy Sor, PA-C  Acetylcysteine  (NAC PO) Take 1,800 mg by mouth 2 (two) times daily.    [provider]  amphetamine-dextroamphetamine (ADDERALL XR) 25 MG 24 hr capsule Take 25 mg by mouth every morning.    [provider]  atorvastatin  (LIPITOR) 10 MG tablet Take 10 mg by mouth every evening.    [provider]  buPROPion  (WELLBUTRIN  XL) 300 MG 24 hr tablet Take 300 mg by mouth daily. 06/21/22   [provider]  cabergoline  (DOSTINEX ) 0.5 MG tablet Take 0.25 mg by mouth 2 (two) times a week. Mondays and Thursdays    [provider]  famotidine  (PEPCID ) 20 MG tablet Take 20 mg by mouth daily.    [provider]  FIBER SELECT GUMMIES PO Take 2 tablets by mouth daily. 5g/per gummies    [provider]  lamoTRIgine  (LAMICTAL ) 200 MG tablet Take 200 mg by mouth 2 (two) times daily.    [provider]  levothyroxine  (SYNTHROID ) 88 MCG tablet Take 88 mcg by mouth daily  before breakfast. 04/05/22   [provider]  Multiple Vitamins-Minerals (MULTIVITAMIN WITH MINERALS) tablet Take 1 tablet by mouth daily.    [provider]  oxyCODONE  (OXY IR/ROXICODONE ) 5 MG immediate release tablet Take 1 tablet (5 mg total) by mouth every 6 (six) hours as needed for severe pain (pain score 7-10). 03/11/23   Cornett, Debby, MD  Probiotic Product (PROBIOTIC PO) Take 2-3 tablets by mouth daily.    [provider]  testosterone cypionate (DEPOTESTOSTERONE CYPIONATE) 200 MG/ML injection Inject 200 mg into the muscle every 14 (fourteen) days. 03/17/22   [provider]    Allergies: Patient has no known allergies.    Review of Systems  Constitutional:  Positive for fever.  All other systems reviewed and are negative.   Updated Vital Signs BP 109/70   Pulse 91   Temp 98.3 F (36.8 C) (Oral)   Resp 18   Ht 6' 3 (1.905 m)   Wt 99.8 kg   SpO2 96%   BMI 27.50 kg/m   Physical Exam Vitals and nursing note reviewed.   43 year old male, resting comfortably and in no acute distress. Vital signs are significant for initial elevated temperature and heart rate, both of which have corrected in the ED. Oxygen saturation is 96%, which is normal. Head is normocephalic and atraumatic. PERRLA,  EOMI. Oropharynx is clear. Neck is nontender and supple without adenopathy Back is nontender and there is no CVA tenderness. Lungs are clear without rales, wheezes, or rhonchi. Chest is nontender. Heart has regular rate and rhythm without murmur. Abdomen is soft, flat, nontender. Extremities have no cyanosis or edema,. Skin is warm and dry without rash. Neurologic: Awake and alert, moves all extremities equally.  (all labs ordered are listed, but only abnormal results are displayed) Labs Reviewed  COMPREHENSIVE METABOLIC PANEL WITH GFR - Abnormal; Notable for the following components:      Result Value   Glucose, Bld 116 (*)    Alkaline Phosphatase  202 (*)    All other components within normal limits  URINALYSIS, W/ REFLEX TO CULTURE (INFECTION SUSPECTED) - Abnormal; Notable for the following components:   APPearance CLOUDY (*)    Hgb urine dipstick SMALL (*)    Protein, ur 30 (*)    Nitrite POSITIVE (*)    Leukocytes,Ua LARGE (*)    Bacteria, UA FEW (*)    Non Squamous Epithelial 0-5 (*)    All other components within normal limits  RESP PANEL BY RT-PCR (RSV, FLU A&B, COVID)  RVPGX2  CULTURE, BLOOD (ROUTINE X 2)  CULTURE, BLOOD (ROUTINE X 2)  URINE CULTURE  CBC WITH DIFFERENTIAL/PLATELET  PROTIME-INR  I-STAT CG4 LACTIC ACID, ED    EKG: None  Radiology: No results found.   Procedures   Medications Ordered in the ED  ibuprofen  (ADVIL ) tablet 600 mg (600 mg Oral Given 02/01/24 0411)                                    Medical Decision Making Amount and/or Complexity of Data Reviewed Labs: ordered. Radiology: ordered.  Risk OTC drugs. Prescription drug management.   Fever with initial tachycardia concerning for sepsis.  Overall presenting picture suggesting a flulike illness.  This a presentation with a wide range of treatment options and carries with it a high risk of morbidity and complications.  Differential diagnosis includes, but is not limited to, viral infection such as influenza and COVID-19 and RSV, pharyngitis, pneumonia, urinary tract infection, other viral illness.  I have reviewed his laboratory tests, and my interpretation is elevated random glucose level which will need to be followed as an outpatient, elevated alkaline phosphatase of uncertain clinical significance, normal lactic acid level-not septic, normal WBC but with slight left shift.  Urinalysis significant for UTI with positive nitrite and greater than 50 WBCs and WBC clumps present.  I have ordered a chest x-ray for completeness, but urinary tract infection appears to be the source of his fever.  I wonder if he might have a urethral stricture  based on his history of some urinary difficulty the last several months.  I have ordered a dose of ceftriaxone  and plan on discharging him with a prescription for cefdinir  and follow-up with his urologist.     Final diagnoses:  Urinary tract infection without hematuria, site unspecified  Elevated random blood glucose level    ED Discharge Orders          Ordered    cefdinir  (OMNICEF ) 300 MG capsule  2 times daily        02/01/24 0730               Raford Lenis, MD 02/01/24 [id number]

## 2024-02-01 NOTE — ED Triage Notes (Signed)
 Pt presents to the ED via POV with complaints of generalized body aches, joint pain, and subjective fevers x 2 days. He notes a temporal temp of 104 PTA and took Tylenol  ~ 20 mins ago. A&Ox4 at this time. Denies CP or SOB.

## 2024-02-03 LAB — URINE CULTURE: Culture: >=100000 — AB

## 2024-02-04 ENCOUNTER — Telehealth (HOSPITAL_BASED_OUTPATIENT_CLINIC_OR_DEPARTMENT_OTHER): Payer: Self-pay | Admitting: *Deleted

## 2024-02-04 NOTE — Progress Notes (Addendum)
 ED Antimicrobial Stewardship Positive Culture Follow Up   George Cooley is an 43 y.o. male who presented to [facility] on 02/01/2024 with a chief complaint of  Chief Complaint  Patient presents with   Fever    Recent Results (from the past 720 hours)  Resp panel by RT-PCR (RSV, Flu A&B, Covid) Anterior Nasal Swab     Status: None   Collection Time: 02/01/24  1:51 AM   Specimen: Anterior Nasal Swab  Result Value Ref Range Status   SARS Coronavirus 2 by RT PCR NEGATIVE NEGATIVE Final    Comment: (NOTE) SARS-CoV-2 target nucleic acids are NOT DETECTED.  The SARS-CoV-2 RNA is generally detectable in upper respiratory specimens during the acute phase of infection. The lowest concentration of SARS-CoV-2 viral copies this assay can detect is 138 copies/mL. A negative result does not preclude SARS-Cov-2 infection and should not be used as the sole basis for treatment or other patient management decisions. A negative result may occur with  improper specimen collection/handling, submission of specimen other than nasopharyngeal swab, presence of viral mutation(s) within the areas targeted by this assay, and inadequate number of viral copies(<138 copies/mL). A negative result must be combined with clinical observations, patient history, and epidemiological information. The expected result is Negative.  Fact Sheet for Patients:  BloggerCourse.com  Fact Sheet for Healthcare Providers:  SeriousBroker.it  This test is no t yet approved or cleared by the [place]  FDA and  has been authorized for detection and/or diagnosis of SARS-CoV-2 by FDA under an Emergency Use Authorization (EUA). This EUA will remain  in effect (meaning this test can be used) for the duration of the COVID-19 declaration under Section 564(b)(1) of the Act, 21 U.S.C.section 360bbb-3(b)(1), unless the authorization is terminated  or revoked sooner.       Influenza  A by PCR NEGATIVE NEGATIVE Final   Influenza B by PCR NEGATIVE NEGATIVE Final    Comment: (NOTE) The Xpert Xpress SARS-CoV-2/FLU/RSV plus assay is intended as an aid in the diagnosis of influenza from Nasopharyngeal swab specimens and should not be used as a sole basis for treatment. Nasal washings and aspirates are unacceptable for Xpert Xpress SARS-CoV-2/FLU/RSV testing.  Fact Sheet for Patients: BloggerCourse.com  Fact Sheet for Healthcare Providers: SeriousBroker.it  This test is not yet approved or cleared by the [place]  FDA and has been authorized for detection and/or diagnosis of SARS-CoV-2 by FDA under an Emergency Use Authorization (EUA). This EUA will remain in effect (meaning this test can be used) for the duration of the COVID-19 declaration under Section 564(b)(1) of the Act, 21 U.S.C. section 360bbb-3(b)(1), unless the authorization is terminated or revoked.     Resp Syncytial Virus by PCR NEGATIVE NEGATIVE Final    Comment: (NOTE) Fact Sheet for Patients: BloggerCourse.com  Fact Sheet for Healthcare Providers: SeriousBroker.it  This test is not yet approved or cleared by the [place]  FDA and has been authorized for detection and/or diagnosis of SARS-CoV-2 by FDA under an Emergency Use Authorization (EUA). This EUA will remain in effect (meaning this test can be used) for the duration of the COVID-19 declaration under Section 564(b)(1) of the Act, 21 U.S.C. section 360bbb-3(b)(1), unless the authorization is terminated or revoked.  Performed at [facility], [address], [place], [place] [address]   Culture, blood (Routine x 2)     Status: None (Preliminary result)   Collection Time: 02/01/24  4:09 AM   Specimen: BLOOD  Result Value Ref Range  Status   Specimen Description   Final    BLOOD SITE NOT SPECIFIED Performed at  [facility], [address], [place], [place] [address]    Special Requests   Final    Blood Culture adequate volume BOTTLES DRAWN AEROBIC AND ANAEROBIC Performed at [facility], [address] [place], [place] [address]    Culture   Final    NO GROWTH 3 DAYS Performed at [facility], [address], [place], [place] [address]    Report Status PENDING  Incomplete  Culture, blood (Routine x 2)     Status: None (Preliminary result)   Collection Time: 02/01/24  4:14 AM   Specimen: BLOOD  Result Value Ref Range Status   Specimen Description   Final    BLOOD SITE NOT SPECIFIED Performed at [facility], [address] [place], [place] [address]    Special Requests   Final    Blood Culture adequate volume BOTTLES DRAWN AEROBIC AND ANAEROBIC Performed at [facility], [address] [place], [place] [address]    Culture   Final    NO GROWTH 3 DAYS Performed at [facility], [address], [place], [place] [address]    Report Status PENDING  Incomplete  Urine Culture     Status: Abnormal   Collection Time: 02/01/24  4:18 AM   Specimen: Urine, Random  Result Value Ref Range Status   Specimen Description   Final    URINE, RANDOM Performed at [facility] [address] [place], [place] [address]    Special Requests   Final    URINE, CLEAN CATCH Performed at [facility], [address], [place], [place] [address]    Culture >=100,000 COLONIES/mL ESCHERICHIA COLI (A)  Final   Report Status 02/03/2024 FINAL  Final   Organism ID, Bacteria ESCHERICHIA COLI (A)  Final      Susceptibility   Escherichia coli - MIC*    AMPICILLIN 8 SENSITIVE Sensitive     CEFAZOLIN  (URINE) Value in next row Sensitive      2 SENSITIVEThis is a modified FDA-approved test that has been validated and its performance characteristics determined by the reporting laboratory.  This laboratory  is certified under the Clinical Laboratory Improvement Amendments CLIA as qualified to perform high complexity clinical laboratory testing.    CEFEPIME Value in next row Sensitive      2 SENSITIVEThis is a modified FDA-approved test that has been validated and its performance characteristics determined by the reporting laboratory.  This laboratory is certified under the Clinical Laboratory Improvement Amendments CLIA as qualified to perform high complexity clinical laboratory testing.    ERTAPENEM Value in next row Sensitive      2 SENSITIVEThis is a modified FDA-approved test that has been validated and its performance characteristics determined by the reporting laboratory.  This laboratory is certified under the Clinical Laboratory Improvement Amendments CLIA as qualified to perform high complexity clinical laboratory testing.    CEFTRIAXONE  Value in next row Sensitive      2 SENSITIVEThis is a modified FDA-approved test that has been validated and its performance characteristics determined by the reporting laboratory.  This laboratory is certified under the Clinical Laboratory Improvement Amendments CLIA as qualified to perform high complexity clinical laboratory testing.    CIPROFLOXACIN Value in next row Sensitive      2 SENSITIVEThis is a modified FDA-approved test that has been validated  and its performance characteristics determined by the reporting laboratory.  This laboratory is certified under the Clinical Laboratory Improvement Amendments CLIA as qualified to perform high complexity clinical laboratory testing.    GENTAMICIN Value in next row Sensitive      2 SENSITIVEThis is a modified FDA-approved test that has been validated and its performance characteristics determined by the reporting laboratory.  This laboratory is certified under the Clinical Laboratory Improvement Amendments CLIA as qualified to perform high complexity clinical laboratory testing.    NITROFURANTOIN Value in next row  Sensitive      2 SENSITIVEThis is a modified FDA-approved test that has been validated and its performance characteristics determined by the reporting laboratory.  This laboratory is certified under the Clinical Laboratory Improvement Amendments CLIA as qualified to perform high complexity clinical laboratory testing.    TRIMETH/SULFA Value in next row Sensitive      2 SENSITIVEThis is a modified FDA-approved test that has been validated and its performance characteristics determined by the reporting laboratory.  This laboratory is certified under the Clinical Laboratory Improvement Amendments CLIA as qualified to perform high complexity clinical laboratory testing.    AMPICILLIN/SULBACTAM Value in next row Sensitive      2 SENSITIVEThis is a modified FDA-approved test that has been validated and its performance characteristics determined by the reporting laboratory.  This laboratory is certified under the Clinical Laboratory Improvement Amendments CLIA as qualified to perform high complexity clinical laboratory testing.    PIP/TAZO Value in next row Sensitive      <=4 SENSITIVEThis is a modified FDA-approved test that has been validated and its performance characteristics determined by the reporting laboratory.  This laboratory is certified under the Clinical Laboratory Improvement Amendments CLIA as qualified to perform high complexity clinical laboratory testing.    MEROPENEM Value in next row Sensitive      <=4 SENSITIVEThis is a modified FDA-approved test that has been validated and its performance characteristics determined by the reporting laboratory.  This laboratory is certified under the Clinical Laboratory Improvement Amendments CLIA as qualified to perform high complexity clinical laboratory testing.    * >=100,000 COLONIES/mL ESCHERICHIA COLI    [x]  Treated with cefdinir , suboptimal  urinary tract concentration []  Patient discharged originally without antimicrobial agent and treatment is  now indicated  New antibiotic prescription:  - Stop cefdinir  - cephalexin 500 mg PO q8h x 7 days   ED Provider: [name], DO   Dolphus Roller, PharmD, BCPS 02/04/2024 11:40 AM

## 2024-02-04 NOTE — Telephone Encounter (Signed)
 Post ED Visit - Positive Culture Follow-up: Successful Patient Follow-Up  Culture assessed and recommendations reviewed by:  []  Rankin Dee, Pharm.D. []  Venetia Gully, Pharm.D., BCPS AQ-ID []  Garrel Crews, Pharm.D., BCPS []  Almarie Lunger, [address].D., BCPS []  [place], [place].D., BCPS, AAHIVP []  Rosaline Bihari, Pharm.D., BCPS, AAHIVP []  Vernell Meier, PharmD, BCPS []  Latanya Hint, PharmD, BCPS []  Donald Medley, PharmD, BCPS [x]  Dolphus Roller, PharmD  Positive urine culture  []  Patient discharged without antimicrobial prescription and treatment is now indicated [x]  Organism is resistant to prescribed ED discharge antimicrobial []  Patient with positive blood cultures  Changes discussed with ED provider: [name], DO New antibiotic prescription Cephalexin 500mg  Q8Hr x 7 days. Qty 21 Stop Cefdinir  Called to [facility], [place], [place]  Contacted patient, date 02/04/24, time [address]   George Cooley 02/04/2024, 12:27 PM

## 2024-02-06 LAB — CULTURE, BLOOD (ROUTINE X 2)
Culture: NO GROWTH
Culture: NO GROWTH
Special Requests: ADEQUATE
Special Requests: ADEQUATE
